# Patient Record
Sex: Male | Born: 1975 | Race: White | Hispanic: No | Marital: Single | Smoking: Former smoker
Health system: Southern US, Community
[De-identification: ages and names within clinical notes are randomized; demographics above are authoritative.]

## PROBLEM LIST (undated history)

## (undated) DIAGNOSIS — F410 Panic disorder [episodic paroxysmal anxiety] without agoraphobia: Secondary | ICD-10-CM

## (undated) DIAGNOSIS — K219 Gastro-esophageal reflux disease without esophagitis: Secondary | ICD-10-CM

## (undated) DIAGNOSIS — F1910 Other psychoactive substance abuse, uncomplicated: Secondary | ICD-10-CM

## (undated) DIAGNOSIS — J45909 Unspecified asthma, uncomplicated: Secondary | ICD-10-CM

## (undated) DIAGNOSIS — F419 Anxiety disorder, unspecified: Secondary | ICD-10-CM

## (undated) DIAGNOSIS — T7840XA Allergy, unspecified, initial encounter: Secondary | ICD-10-CM

## (undated) DIAGNOSIS — F1010 Alcohol abuse, uncomplicated: Secondary | ICD-10-CM

## (undated) DIAGNOSIS — I10 Essential (primary) hypertension: Secondary | ICD-10-CM

## (undated) HISTORY — PX: OTHER SURGICAL HISTORY: SHX169

## (undated) HISTORY — DX: Anxiety disorder, unspecified: F41.9

## (undated) HISTORY — DX: Allergy, unspecified, initial encounter: T78.40XA

## (undated) HISTORY — DX: Panic disorder (episodic paroxysmal anxiety): F41.0

## (undated) HISTORY — DX: Alcohol abuse, uncomplicated: F10.10

## (undated) HISTORY — DX: Unspecified asthma, uncomplicated: J45.909

## (undated) HISTORY — DX: Gastro-esophageal reflux disease without esophagitis: K21.9

## (undated) HISTORY — PX: TYMPANOSTOMY TUBE PLACEMENT: SHX32

## (undated) HISTORY — DX: Other psychoactive substance abuse, uncomplicated: F19.10

---

## 1998-01-30 ENCOUNTER — Encounter: Admission: RE | Admit: 1998-01-30 | Discharge: 1998-01-30 | Payer: Self-pay | Admitting: *Deleted

## 2001-04-28 ENCOUNTER — Emergency Department (HOSPITAL_COMMUNITY): Admission: EM | Admit: 2001-04-28 | Discharge: 2001-04-28 | Payer: Self-pay | Admitting: *Deleted

## 2001-04-28 ENCOUNTER — Encounter: Payer: Self-pay | Admitting: *Deleted

## 2007-09-07 ENCOUNTER — Encounter: Admission: RE | Admit: 2007-09-07 | Discharge: 2007-09-07 | Payer: Self-pay | Admitting: Gastroenterology

## 2015-10-13 ENCOUNTER — Ambulatory Visit: Payer: Self-pay | Admitting: Internal Medicine

## 2015-11-25 ENCOUNTER — Ambulatory Visit (INDEPENDENT_AMBULATORY_CARE_PROVIDER_SITE_OTHER): Payer: BLUE CROSS/BLUE SHIELD | Admitting: Family Medicine

## 2015-11-25 ENCOUNTER — Encounter: Payer: Self-pay | Admitting: Family Medicine

## 2015-11-25 VITALS — BP 122/72 | HR 64 | Wt 199.8 lb

## 2015-11-25 DIAGNOSIS — K219 Gastro-esophageal reflux disease without esophagitis: Secondary | ICD-10-CM

## 2015-11-25 DIAGNOSIS — F410 Panic disorder [episodic paroxysmal anxiety] without agoraphobia: Secondary | ICD-10-CM | POA: Diagnosis not present

## 2015-11-25 DIAGNOSIS — F1029 Alcohol dependence with unspecified alcohol-induced disorder: Secondary | ICD-10-CM

## 2015-11-25 LAB — CBC WITH DIFFERENTIAL/PLATELET
Basophils Absolute: 52 cells/uL (ref 0–200)
Basophils Relative: 1 %
Eosinophils Absolute: 208 cells/uL (ref 15–500)
Eosinophils Relative: 4 %
HCT: 47.9 % (ref 38.5–50.0)
Hemoglobin: 16.3 g/dL (ref 13.2–17.1)
Lymphocytes Relative: 34 %
Lymphs Abs: 1768 cells/uL (ref 850–3900)
MCH: 29.9 pg (ref 27.0–33.0)
MCHC: 34 g/dL (ref 32.0–36.0)
MCV: 87.9 fL (ref 80.0–100.0)
MPV: 11.9 fL (ref 7.5–12.5)
Monocytes Absolute: 364 cells/uL (ref 200–950)
Monocytes Relative: 7 %
Neutro Abs: 2808 cells/uL (ref 1500–7800)
Neutrophils Relative %: 54 %
Platelets: 137 10*3/uL — ABNORMAL LOW (ref 140–400)
RBC: 5.45 MIL/uL (ref 4.20–5.80)
RDW: 13.4 % (ref 11.0–15.0)
WBC: 5.2 10*3/uL (ref 4.0–10.5)

## 2015-11-25 NOTE — Patient Instructions (Signed)
-[facility] [phone] [facility] [phone] [facility] [phone]  Gastroesophageal Reflux Disease, Adult Normally, food travels down the esophagus and stays in the stomach to be digested. However, when a person has gastroesophageal reflux disease (GERD), food and stomach acid move back up into the esophagus. When this happens, the esophagus becomes sore and inflamed. Over time, GERD can create small holes (ulcers) in the lining of the esophagus.  CAUSES This condition is caused by a problem with the muscle between the esophagus and the stomach (lower esophageal sphincter, or LES). Normally, the LES muscle closes after food passes through the esophagus to the stomach. When the LES is weakened or abnormal, it does not close properly, and that allows food and stomach acid to go back up into the esophagus. The LES can be weakened by certain dietary substances, medicines, and medical conditions, including:  Tobacco use.  Pregnancy.  Having a hiatal hernia.  Heavy alcohol use.  Certain foods and beverages, such as coffee, chocolate, onions, and peppermint. RISK FACTORS This condition is more likely to develop in:  People who have an increased body weight.  People who have connective tissue disorders.  People who use NSAID medicines. SYMPTOMS Symptoms of this condition include:  Heartburn.  Difficult or painful swallowing.  The feeling of having a lump in the throat.  Abitter taste in the mouth.  Bad breath.  Having a large amount of saliva.  Having an upset or bloated stomach.  Belching.  Chest pain.  Shortness of breath or wheezing.  Ongoing (chronic) cough or a night-time cough.  Wearing away of tooth enamel.  Weight loss. Different conditions can cause chest pain. Make sure to see your health care provider if you experience chest pain. DIAGNOSIS Your health care provider will take a medical history and perform a physical exam. To  determine if you have mild or severe GERD, your health care provider may also monitor how you respond to treatment. You may also have other tests, including:  An endoscopy toexamine your stomach and esophagus with a small camera.  A test thatmeasures the acidity level in your esophagus.  A test thatmeasures how much pressure is on your esophagus.  A barium swallow or modified barium swallow to show the shape, size, and functioning of your esophagus. TREATMENT The goal of treatment is to help relieve your symptoms and to prevent complications. Treatment for this condition may vary depending on how severe your symptoms are. Your health care provider may recommend:  Changes to your diet.  Medicine.  Surgery. HOME CARE INSTRUCTIONS Diet  Follow a diet as recommended by your health care provider. This may involve avoiding foods and drinks such as:  Coffee and tea (with or without caffeine).  Drinks that containalcohol.  Energy drinks and sports drinks.  Carbonated drinks or sodas.  Chocolate and cocoa.  Peppermint and mint flavorings.  Garlic and onions.  Horseradish.  Spicy and acidic foods, including peppers, chili powder, curry powder, vinegar, hot sauces, and barbecue sauce.  Citrus fruit juices and citrus fruits, such as oranges, lemons, and limes.  Tomato-based foods, such as red sauce, chili, salsa, and pizza with red sauce.  Fried and fatty foods, such as donuts, french fries, potato chips, and high-fat dressings.  High-fat meats, such as hot dogs and fatty cuts of red and white meats, such as rib eye steak, sausage, ham, and bacon.  High-fat dairy items, such as whole milk, butter, and cream cheese.  Eat small, frequent meals instead of  large meals.  Avoid drinking large amounts of liquid with your meals.  Avoid eating meals during the 2-3 hours before bedtime.  Avoid lying down right after you eat.  Do not exercise right after you eat. General  Instructions  Pay attention to any changes in your symptoms.  Take over-the-counter and prescription medicines only as told by your health care provider. Do not take aspirin, ibuprofen, or other NSAIDs unless your health care provider told you to do so.  Do not use any tobacco products, including cigarettes, chewing tobacco, and e-cigarettes. If you need help quitting, ask your health care provider.  Wear loose-fitting clothing. Do not wear anything tight around your waist that causes pressure on your abdomen.  Raise (elevate) the head of your bed 6 inches (15cm).  Try to reduce your stress, such as with yoga or meditation. If you need help reducing stress, ask your health care provider.  If you are overweight, reduce your weight to an amount that is healthy for you. Ask your health care provider for guidance about a safe weight loss goal.  Keep all follow-up visits as told by your health care provider. This is important. SEEK MEDICAL CARE IF:  You have new symptoms.  You have unexplained weight loss.  You have difficulty swallowing, or it hurts to swallow.  You have wheezing or a persistent cough.  Your symptoms do not improve with treatment.  You have a hoarse voice. SEEK IMMEDIATE MEDICAL CARE IF:  You have pain in your arms, neck, jaw, teeth, or back.  You feel sweaty, dizzy, or light-headed.  You have chest pain or shortness of breath.  You vomit and your vomit looks like blood or coffee grounds.  You faint.  Your stool is bloody or black.  You cannot swallow, drink, or eat.   This information is not intended to replace advice given to you by your health care provider. Make sure you discuss any questions you have with your health care provider.   Document Released: 03/03/2005 Document Revised: 02/12/2015 Document Reviewed: 09/18/2014 Elsevier Interactive Patient Education [employer]2016 Elsevier Inc.

## 2015-11-25 NOTE — Progress Notes (Signed)
Subjective:    Patient ID: Juan Hodge, male    DOB: [birth year], 40 y.o.   MRN: [id number]  HPI Chief Complaint  Patient presents with  . new pt    panic attack- GI issues. alcohol use   He is new to the practice and here to establish care. No previous PCP. [place]ived in [place] years ago. States he had blood work then, nothing since.  States he suffers from generalized anxiety, panic attacks, alcoholism, and GI issues. Stopped marijuana about 4-5 years ago. Stopped using cocaine in his 5120s. States he has abused benzos in past. Has addictive personality.  Sees a psychologist, Vito Bergeramala Duncan on [address] for about a year and likes her. He states he wants to stop drinking. Has been taking nexium for past 2 weeks without relief of reflux. States he has tried multiple PPIs in past. Ongoing history of reflux, belching, and occasional vomiting. Denies hematemesis.   PMH: questionable history of high blood pressure and cholesterol.   Daily alcohol use since his 7020s. Started out with binge drinking.  States he has been struggling with hangovers more lately and panic attacks more often . States he has multiple anxiety attacks daily. Has panic attacks about 3-4 per month, lasts 10 minutes to 1 hour. Exercise brings on panic. Heartburn also causes a great deal of anxiety. States he aborts his panic attacks by going to his mothers and she can talk him down. He has also taken his mother's Klonopin in the past with relief.  Drinks half to whole bottle of wine now. He is dependent for sleep. States he has been this way for several years.   Tried to go 2-3 days without drinking and he could not sleep. Denies drinking during the day. States he works during the day. Lately he has been having a difficult time at work due to panic and anxiety.   He is anxious that his mom is going out of town for a couple of weeks since he depends on her to help him with panic attacks.   States he went to [facility] a few  years ago and had a barium swallow.  Spent 2 years in prison 2006-2008. Developed stomach issues then, he was belching and vomiting quite often then.   Does not smoke.   He went to inpatient rehab at age 40 for etoh, acid, marijuana. In prison he worked a 12 step program.   Review of Systems Pertinent positives and negatives in the history of present illness.     Objective:   Physical Exam  Constitutional: He is oriented to person, place, and time. He appears well-developed and well-nourished. No distress.  Eyes: Conjunctivae and EOM are normal. Pupils are equal, round, and reactive to light. No scleral icterus.  Neck: Normal range of motion. Neck supple. No thyromegaly present.  Cardiovascular: Normal rate, regular rhythm and normal heart sounds.  Exam reveals no gallop and no friction rub.   No murmur heard. Pulmonary/Chest: Effort normal and breath sounds normal.  Abdominal: Soft. Bowel sounds are normal. He exhibits no distension. There is no hepatosplenomegaly. There is no tenderness. There is no rebound and no guarding.  Lymphadenopathy:    He has no cervical adenopathy.  Neurological: He is alert and oriented to person, place, and time. He has normal reflexes. He displays no tremor. Coordination normal.  No asterixis  Skin: Skin is warm and dry.  Psychiatric: He has a normal mood and affect. His behavior is normal.  Judgment and thought content normal.   BP 122/72 mmHg  Pulse 64  Wt 199 lb 12.8 oz (90.629 kg)     Assessment & Plan:  Gastroesophageal reflux disease, esophagitis presence not specified - Plan: CBC with Differential/Platelet, Comprehensive metabolic panel  Alcohol dependence with unspecified alcohol-induced disorder (HCC) - Plan: CBC with Differential/Platelet, Comprehensive metabolic panel  Panic disorder - Plan: CBC with Differential/Platelet, Comprehensive metabolic panel  Discussed patient with Dr. Susann Givens and agree that what is best for this patient is  for him to contact [facility] for alcohol detox and addiction issues. It would not be appropriate or safe for me to prescribe medication for anxiety or panic. He is not suicidal and I do not feel that he is in any harm.  Recommend that if his panic worsens or he has thoughts of SI or HI that he call 911 or go immediately to  [facility].   Discussed that I will treat his GERD and he also has an appointment with Dr. Leone Payor in July which he made prior to today's visit. Recommend strongly that he keep this appointment. Dexilant sample given with instructions. He will switch and take 2 Nexium tabs 30 minutes prior to breakfast when the sample is out. Discussed that cutting back on alcohol will help reflux and also discussed GERD management.  Follow up in 1 month for GERD or sooner if needed.  Will call with lab results.  Spent a minimum of 45 minutes face to face with patient and at least 50% was in counseling and coordination of care.

## 2015-11-26 LAB — COMPREHENSIVE METABOLIC PANEL
ALT: 30 U/L (ref 9–46)
AST: 20 U/L (ref 10–40)
Albumin: 4.6 g/dL (ref 3.6–5.1)
Alkaline Phosphatase: 46 U/L (ref 40–115)
BUN: 13 mg/dL (ref 7–25)
CO2: 22 mmol/L (ref 20–31)
Calcium: 9.4 mg/dL (ref 8.6–10.3)
Chloride: 104 mmol/L (ref 98–110)
Creat: 0.86 mg/dL (ref 0.60–1.35)
Glucose, Bld: 86 mg/dL (ref 65–99)
Potassium: 4.2 mmol/L (ref 3.5–5.3)
Sodium: 138 mmol/L (ref 135–146)
Total Bilirubin: 0.8 mg/dL (ref 0.2–1.2)
Total Protein: 7.3 g/dL (ref 6.1–8.1)

## 2015-12-18 ENCOUNTER — Encounter: Payer: Self-pay | Admitting: Family Medicine

## 2015-12-18 ENCOUNTER — Ambulatory Visit (INDEPENDENT_AMBULATORY_CARE_PROVIDER_SITE_OTHER): Payer: BLUE CROSS/BLUE SHIELD | Admitting: Family Medicine

## 2015-12-18 VITALS — BP 124/84 | HR 72 | Wt 199.6 lb

## 2015-12-18 DIAGNOSIS — K219 Gastro-esophageal reflux disease without esophagitis: Secondary | ICD-10-CM | POA: Diagnosis not present

## 2015-12-18 DIAGNOSIS — F419 Anxiety disorder, unspecified: Secondary | ICD-10-CM | POA: Insufficient documentation

## 2015-12-18 DIAGNOSIS — F410 Panic disorder [episodic paroxysmal anxiety] without agoraphobia: Secondary | ICD-10-CM

## 2015-12-18 DIAGNOSIS — F1010 Alcohol abuse, uncomplicated: Secondary | ICD-10-CM | POA: Insufficient documentation

## 2015-12-18 NOTE — Progress Notes (Signed)
   Subjective:    Patient ID: Juan Hodge, male    DOB: 01-Aug-1975, 40 y.o.   MRN: [id number]  HPI Chief Complaint  Patient presents with  . follow-up    follow-up on GERD- med not helping   He is here for follow up on GERD. He tried Dexilant and has been taking Nexium 2 tabs in the morning for several weeks without any relief of symptoms. States he has been doing appropriate GERD management that we discussed her last visit and this has not helped. Has appointment with GI next week.   States he occasionally wakes up with his heart "racing" for about a year and this has gotten some better. Has been meditating more and using sleep apps. Has not occurred lately. He is still drinking alcohol every evening. Has tried to cut back to 1/2 bottle of wine nightly  Reports history of Panic attacks mainly in the morning in the morning but not as often. He has been seen a psychologist and states he would like to be referred to a psychiatrist as well. States he is interested in possibly taking medication for his symptoms. He does admit however that he has an addictive personality and in the past has abused alcohol and other substances. Also states he has abused anxiety medications in the past. Denies SI/HI. Denies fever, chills, headache, dizziness, chest pain, DOE, cough, nausea, vomiting, diarrhea.   Review of Systems Pertinent positives and negatives in the history of present illness.     Objective:   Physical Exam BP 124/84 mmHg  Pulse 72  Wt 199 lb 9.6 oz (90.538 kg)  Alert and oriented and in no acute distress. Pharyngeal area is normal.  Cardiac exam shows a regular sinus rhythm without murmurs or gallops. Extremities without edema.      Assessment & Plan:  Gastroesophageal reflux disease, esophagitis presence not specified  Alcohol abuse, daily use - Plan: Ambulatory referral to Psychiatry  Anxiety disorder, unspecified anxiety disorder type - Plan: Ambulatory referral to  Psychiatry  Panic attacks - Plan: Ambulatory referral to Psychiatry  Plan to have him see GI next week and appreciate feedback regarding his symptoms. Continue GERD management as discussed and avoid offending foods. He may continue on Nexium until seeing GI.  He continues to drink alcohol every evening. Is not interested in detox at this time.  Referral made to psychiatry for anxiety and panic. I do not feel that he is harmful to himself or others. Follow up in 1 month.

## 2015-12-23 ENCOUNTER — Encounter: Payer: Self-pay | Admitting: Internal Medicine

## 2015-12-23 ENCOUNTER — Ambulatory Visit (INDEPENDENT_AMBULATORY_CARE_PROVIDER_SITE_OTHER): Payer: BLUE CROSS/BLUE SHIELD | Admitting: Internal Medicine

## 2015-12-23 VITALS — BP 130/90 | HR 78 | Ht 70.0 in | Wt 199.0 lb

## 2015-12-23 DIAGNOSIS — F1010 Alcohol abuse, uncomplicated: Secondary | ICD-10-CM | POA: Diagnosis not present

## 2015-12-23 DIAGNOSIS — F410 Panic disorder [episodic paroxysmal anxiety] without agoraphobia: Secondary | ICD-10-CM

## 2015-12-23 DIAGNOSIS — K219 Gastro-esophageal reflux disease without esophagitis: Secondary | ICD-10-CM

## 2015-12-23 MED ORDER — SUCRALFATE 1 G PO TABS: 1.0000 g | ORAL_TABLET | Freq: Three times a day (TID) | ORAL | Status: DC

## 2015-12-23 NOTE — Progress Notes (Signed)
Referred by Hetty BlendVickie Henson, NP  Subjective:    Patient ID: Juan FeltJeffrey M Phillis, male    DOB: June 26, 1975, [age] y.o.   MRN: [id number]  CC: epigastric pain and vomiting  HPI  40 y.o. Male with a pmh of anxiety, panic attacks, GERD, and alcohol abuse, presenting to the office for epigastric pain for the past 10 years, described as burning. Associates belching, regurgitation, bloating with the pain. Worsened when lying flat and when eating spicy or acidic foods. States he has tried multiple PPIs with no relief in symptoms. Has diarrhea  3 days out of a week,  ranging from loose to watery stools. No weight loss, dysphasia, or hematemesis, melena, or hematochezia. Drinks 1/2 a bottle of wine every night, but states he is cutting back. States he awakes early with increased heart rate, clammy hands, and worry that he is having a panic attack, resolves with meditation techniques he learned with psychologist. Denies fevers, chills, early satiety, nausea, and constipation.   Allergies  Allergen Reactions  . Erythromycin Other (See Comments)    Tears stomach up   Outpatient Prescriptions Prior to Visit  Medication Sig Dispense Refill  . Esomeprazole Magnesium (NEXIUM PO) Take 2 capsules by mouth daily.      No facility-administered medications prior to visit.   Past Medical History  Diagnosis Date  . Substance abuse   . Alcohol abuse, daily use   . Anxiety   . Panic attacks   . GERD (gastroesophageal reflux disease)   . Asthma    Past Surgical History  Procedure Laterality Date  . Tubes in ears      as a child   Social History   Social History  . Marital Status: Single    Spouse Name: N/A  . Number of Children: 0  . Years of Education: N/A   Occupational History  . sales    Social History Main Topics  . Smoking status: Never Smoker   . Smokeless tobacco: Never Used  . Alcohol Use: 0.0 oz/week    0 Standard drinks or equivalent per week     Comment: 2-4 glasses of wine. (1/2-1 bottle  a night)  . Drug Use: No     Comment: former user of marijuana and cocaine  . Sexual Activity: Not Asked   Other Topics Concern  . None   Social History Narrative   Family History  Problem Relation Age of Onset  . Heart disease Father       Review of Systems See HPI, all other systems are negative    Objective:   Physical Exam  @BP  130/90 mmHg  Pulse 78  Ht 5\' 10"  (1.778 m)  Wt 199 lb (90.266 kg)  BMI 28.55 kg/m2@  General:  Anxious appearing, well-developed, well-nourished, but no acute distress Eyes:  anicteric. ENT:   Mouth and posterior pharynx free of lesions.  Neck:   supple w/o thyromegaly or mass.  Lungs: Clear to auscultation bilaterally. Heart:  S1S2, no rubs, murmurs, gallops. Abdomen:  soft, non-tender, no hepatosplenomegaly, hernia, or mass and BS+.  Lymph:  no cervical or supraclavicular adenopathy. Extremities:   no edema, cyanosis or clubbing Skin   no rash. Neuro:  A&O x 3.  Psych:  appropriate mood and  Affect.   Data Reviewed: Labs from 11/25/15 Barium [facility]GI from 09/07/07      Assessment & Plan:   Encounter Diagnoses  Name Primary?  . Gastroesophageal reflux disease, esophagitis presence not specified Yes  . Panic attacks   .  Alcohol abuse    Symptoms appear consistent with acid reflux and panic attacks, mostly panic. Given his history of PPI failing we will prescribe carafate to help with epigastric pain and reflux. Encouraged him to continue meditation techniques to help with panic attacks. PCP is referring him to see a psychiatrist to help with his anxiety. Medication appears indicated.    Patient is aware of alcohol abuse and is actively trying to cut back on alcohol consumption.  Debbra Riding PA-S  I have seen the patient with Mr. Harlon Flor and he has served as a Neurosurgeon.  ZO:XWRUEA Suezanne Jacquet, NP

## 2015-12-23 NOTE — Patient Instructions (Addendum)
We have sent the following medications to your pharmacy for you to pick up at your convenience: Carafate  Follow up with Dr Leone PayorGessner as needed.  Follow reflux diet. See below.  Make sure to continue trying to reduce your alcohol consumption.  Make sure to follow with psychiatry.  If you are age 40 or older, your body mass index should be between 23-30. Your Body mass index is 28.55 kg/(m^2). If this is out of the aforementioned range listed, please consider follow up with your Primary Care Provider.  If you are age 40 or younger, your body mass index should be between 19-25. Your Body mass index is 28.55 kg/(m^2). If this is out of the aformentioned range listed, please consider follow up with your Primary Care Provider.    Thank you for choosing [facility]  Dr Stan Headarl Gessner  ________________________________________________________________ Food Choices for Gastroesophageal Reflux Disease, Adult When you have gastroesophageal reflux disease (GERD), the foods you eat and your eating habits are very important. Choosing the right foods can help ease the discomfort of GERD. WHAT GENERAL GUIDELINES DO I NEED TO FOLLOW?  Choose fruits, vegetables, whole grains, low-fat dairy products, and low-fat meat, fish, and poultry.  Limit fats such as oils, salad dressings, butter, nuts, and avocado.  Keep a food diary to identify foods that cause symptoms.  Avoid foods that cause reflux. These may be different for different people.  Eat frequent small meals instead of three large meals each day.  Eat your meals slowly, in a relaxed setting.  Limit fried foods.  Cook foods using methods other than frying.  Avoid drinking alcohol.  Avoid drinking large amounts of liquids with your meals.  Avoid bending over or lying down until 2-3 hours after eating. WHAT FOODS ARE NOT RECOMMENDED? The following are some foods and drinks that may worsen your symptoms: Vegetables Tomatoes. Tomato juice.  Tomato and spaghetti sauce. Chili peppers. Onion and garlic. Horseradish. Fruits Oranges, grapefruit, and lemon (fruit and juice). Meats High-fat meats, fish, and poultry. This includes hot dogs, ribs, ham, sausage, salami, and bacon. Dairy Whole milk and chocolate milk. Sour cream. Cream. Butter. Ice cream. Cream cheese.  Beverages Coffee and tea, with or without caffeine. Carbonated beverages or energy drinks. Condiments Hot sauce. Barbecue sauce.  Sweets/Desserts Chocolate and cocoa. Donuts. Peppermint and spearmint. Fats and Oils High-fat foods, including [place] fries and potato chips. Other Vinegar. Strong spices, such as black pepper, white pepper, red pepper, cayenne, curry powder, cloves, ginger, and chili powder. The items listed above may not be a complete list of foods and beverages to avoid. Contact your dietitian for more information.   This information is not intended to replace advice given to you by your health care provider. Make sure you discuss any questions you have with your health care provider.   Document Released: 05/24/2005 Document Revised: 06/14/2014 Document Reviewed: 03/28/2013 Elsevier Interactive Patient Education [employer]2016 Elsevier Inc.  I appreciate the opportunity to care for you. Stan Headarl Gessner, MD, [facility]

## 2015-12-24 ENCOUNTER — Encounter: Payer: Self-pay | Admitting: Internal Medicine

## 2016-03-10 ENCOUNTER — Ambulatory Visit: Payer: BLUE CROSS/BLUE SHIELD | Admitting: Family Medicine

## 2016-03-12 ENCOUNTER — Encounter: Payer: Self-pay | Admitting: Family Medicine

## 2016-03-23 ENCOUNTER — Encounter: Payer: Self-pay | Admitting: Family Medicine

## 2016-03-23 ENCOUNTER — Ambulatory Visit (INDEPENDENT_AMBULATORY_CARE_PROVIDER_SITE_OTHER): Payer: BLUE CROSS/BLUE SHIELD | Admitting: Family Medicine

## 2016-03-23 VITALS — BP 140/82 | HR 91 | Wt 197.6 lb

## 2016-03-23 DIAGNOSIS — F1311 Sedative, hypnotic or anxiolytic abuse, in remission: Secondary | ICD-10-CM | POA: Diagnosis not present

## 2016-03-23 DIAGNOSIS — F410 Panic disorder [episodic paroxysmal anxiety] without agoraphobia: Secondary | ICD-10-CM

## 2016-03-23 DIAGNOSIS — F411 Generalized anxiety disorder: Secondary | ICD-10-CM | POA: Diagnosis not present

## 2016-03-23 DIAGNOSIS — F1010 Alcohol abuse, uncomplicated: Secondary | ICD-10-CM

## 2016-03-23 NOTE — Progress Notes (Signed)
   Subjective:    Patient ID: Juan Hodge, male    DOB: 1975/09/19, 40 y.o.   MRN: [id number]  HPI Chief Complaint  Patient presents with  . aniexty    aniexty   He is a 40 year old male with a history of anxiety, panic attacks, alcohol abuse, GERD and benzodiazapine abuse who is here with complaints of anxiety. He is seeing a psychologist Vito Bergeramala Duncan in [place] for this long time issue and states she diagnosed him with generalized anxiety.  States he saw a psychiatrist Jaquita RectorKaren Davis and was prescribed Buspar. States he did not like how it made him feel. States he had headaches on the medication and it did not help his anxiety. Took the medication for a month and a half and states he gave it ample time to work.  States he is no longer seeing his psychiatrist because he was late to an appointment and they would not see him. States he does not want to see her again, states they did not connect.  He is requesting that I speak with his psychologist regarding his diagnosis and possible treatment in the future.  States panic attacks are happening less often. States he is having issues sleeping.  States he is drinking less alcohol. Has been not drinking on some occasions.   He is in a new relationship, with an ex girlfriend and states this is going well.  Denies SI or HI.  States he feels physically well.  Denies fever, chills, headache, chest pain, abdominal pain, GI or GU symptoms.    Past Medical History:  Diagnosis Date  . Alcohol abuse, daily use   . Anxiety   . Asthma   . GERD (gastroesophageal reflux disease)   . Panic attacks   . Substance abuse    GAD 7 : Generalized Anxiety Score 03/23/2016 11/25/2015  Nervous, Anxious, on Edge 3 3  Control/stop worrying 0 3  Worry too much - different things 3 3  Trouble relaxing 3 3  Restless 0 0  Easily annoyed or irritable 3 3  Afraid - awful might happen 3 1  Total GAD 7 Score 15 16  Anxiety Difficulty Somewhat difficult Not difficult  at all     Review of Systems Pertinent positives and negatives in the history of present illness.     Objective:   Physical Exam BP 140/82   Pulse 91   Wt 197 lb 9.6 oz (89.6 kg)   BMI 28.35 kg/m   Alert and oriented and in no acute distress. Not otherwise examined.      Assessment & Plan:  Generalized anxiety disorder  Panic attacks  Benzodiazepine abuse in remission  Alcohol abuse, daily use  Plan to have him sign a written consent that allows his psychologist and I to discuss his case. He actually reports some improvement in panic attacks and anxiety but would like to try a different medication for this. He has admitted to abusing anti-anxiety medications at a previous visit. Will request from patient to also speak to the psychiatrist he was seeing.  Discussed patient with Dr. Susann GivensLalonde and we are in agreement that nothing has changed regarding the fact that I am not interested in prescribing psychotropic medications for him and that he will need to find a new psychiatrist to help him with this. Encouraged him to continue cutting back on alcohol use.  Follow up after I hear from his psychologist.

## 2016-03-31 ENCOUNTER — Telehealth: Payer: Self-pay | Admitting: Family Medicine

## 2016-03-31 NOTE — Telephone Encounter (Signed)
His therapist Juan Hodge contacted me at the patient's request. She is concerned about his GERD symptoms and was not aware that patient had been evaluated by GI in the past few weeks. She would also like for me to consider prescribing anti-anxiety medication for him. Discussed that he has a history of benzodiazapine abuse and drinks alcohol daily. Plan to obtain release to discuss patient with his psychiatrist, Juan Hodge, who he does not want to see anymore. He was taking Buspar and did not like this medication. Plan to refer him to another psychiatrist and his therapist agrees with this plan.

## 2016-05-05 ENCOUNTER — Telehealth: Payer: Self-pay

## 2016-05-05 NOTE — Telephone Encounter (Signed)
Records from Deatra RobinsonKaren Jones, NP placed in your folder for review. Trixie Rude/RLB

## 2017-01-17 ENCOUNTER — Ambulatory Visit (INDEPENDENT_AMBULATORY_CARE_PROVIDER_SITE_OTHER): Payer: Self-pay | Admitting: Family Medicine

## 2017-01-17 ENCOUNTER — Encounter: Payer: Self-pay | Admitting: Family Medicine

## 2017-01-17 VITALS — BP 120/90 | HR 86 | Temp 98.2°F | Wt 209.8 lb

## 2017-01-17 DIAGNOSIS — L02415 Cutaneous abscess of right lower limb: Secondary | ICD-10-CM

## 2017-01-17 DIAGNOSIS — L0293 Carbuncle, unspecified: Secondary | ICD-10-CM

## 2017-01-17 LAB — COMPREHENSIVE METABOLIC PANEL
ALT: 33 U/L (ref 9–46)
AST: 23 U/L (ref 10–40)
Albumin: 4.3 g/dL (ref 3.6–5.1)
Alkaline Phosphatase: 56 U/L (ref 40–115)
BUN: 12 mg/dL (ref 7–25)
CO2: 24 mmol/L (ref 20–32)
Calcium: 9.9 mg/dL (ref 8.6–10.3)
Chloride: 103 mmol/L (ref 98–110)
Creat: 0.98 mg/dL (ref 0.60–1.35)
Glucose, Bld: 83 mg/dL (ref 65–99)
Potassium: 4.2 mmol/L (ref 3.5–5.3)
Sodium: 139 mmol/L (ref 135–146)
Total Bilirubin: 0.9 mg/dL (ref 0.2–1.2)
Total Protein: 7.2 g/dL (ref 6.1–8.1)

## 2017-01-17 LAB — CBC WITH DIFFERENTIAL/PLATELET
Basophils Absolute: 0 cells/uL (ref 0–200)
Basophils Relative: 0 %
Eosinophils Absolute: 255 cells/uL (ref 15–500)
Eosinophils Relative: 5 %
HCT: 46.7 % (ref 38.5–50.0)
Hemoglobin: 16.1 g/dL (ref 13.2–17.1)
Lymphocytes Relative: 37 %
Lymphs Abs: 1887 cells/uL (ref 850–3900)
MCH: 30.1 pg (ref 27.0–33.0)
MCHC: 34.5 g/dL (ref 32.0–36.0)
MCV: 87.5 fL (ref 80.0–100.0)
MPV: 11.7 fL (ref 7.5–12.5)
Monocytes Absolute: 561 cells/uL (ref 200–950)
Monocytes Relative: 11 %
Neutro Abs: 2397 cells/uL (ref 1500–7800)
Neutrophils Relative %: 47 %
Platelets: 147 10*3/uL (ref 140–400)
RBC: 5.34 MIL/uL (ref 4.20–5.80)
RDW: 13.2 % (ref 11.0–15.0)
WBC: 5.1 10*3/uL (ref 4.0–10.5)

## 2017-01-17 MED ORDER — MUPIROCIN 2 % EX OINT: 1.0000 "application " | TOPICAL_OINTMENT | Freq: Two times a day (BID) | CUTANEOUS | 0 refills | Status: DC

## 2017-01-17 MED ORDER — SULFAMETHOXAZOLE-TRIMETHOPRIM 800-160 MG PO TABS: 1.0000 | ORAL_TABLET | Freq: Two times a day (BID) | ORAL | 0 refills | Status: DC

## 2017-01-17 NOTE — Patient Instructions (Signed)
Take the antibiotic by mouth and use the topical antibiotic.  Use warm compresses.   Return if you notice any new or worsening symptoms.   We will call you with lab results.

## 2017-01-17 NOTE — Progress Notes (Signed)
   Subjective:    Patient ID: Juan Hodge, male    DOB: September 04, 1975, 41 y.o.   MRN: [id number]  HPI Chief Complaint  Patient presents with  . boil    boil on right leg, 3 days ago. popped it himself 2 days ago.    He is here due to a 3 day history of a sore to his right lower leg. States it started as a pimple and he popped it 2 days ago and yellowish pus was expelled. States he has had 4 boils in different areas over the past 2 months.  Denies history of MRSA, MRSA or any autoimmune issues.   He is engaged now. Doing well emotionally.  States he has been eating too many carbohydrates and sugar.   Denies fever, chills, dizziness, chest pain, shortness of breath, abdominal pain, N/V/D.   Reviewed allergies, medications, past medical, surgical, and social history.    Review of Systems Pertinent positives and negatives in the history of present illness.     Objective:   Physical Exam BP 120/90   Pulse 86   Temp 98.2 F (36.8 C) (Oral)   Wt 209 lb 12.8 oz (95.2 kg)   BMI 30.10 kg/m   Right lateral lower extremity with a 0.5 cm open area that is draining clear fluid.  Mild erythema to immediate outer edge. No induration or fluctuance. RLE is neurovascularly intact.       Assessment & Plan:  Abscess of right leg - Plan: sulfamethoxazole-trimethoprim (BACTRIM DS,SEPTRA DS) 800-160 MG tablet, CBC with Differential/Platelet, Comprehensive metabolic panel, HIV antibody, mupirocin ointment (BACTROBAN) 2 %  Recurrent boils - Plan: CBC with Differential/Platelet, Comprehensive metabolic panel, HIV antibody  Will cover him with Bactrim and topical mupirocin and check labs to look for underlying etiology for recurrent boils.  He is worried about finances and no longer has insurance. He will follow up if symptoms worsen or are not improving

## 2017-01-18 LAB — HIV ANTIBODY (ROUTINE TESTING W REFLEX): HIV 1&2 Ab, 4th Generation: NONREACTIVE

## 2017-07-27 ENCOUNTER — Ambulatory Visit (INDEPENDENT_AMBULATORY_CARE_PROVIDER_SITE_OTHER): Payer: Self-pay | Admitting: Family Medicine

## 2017-07-27 ENCOUNTER — Encounter: Payer: Self-pay | Admitting: Family Medicine

## 2017-07-27 VITALS — BP 130/90 | HR 70 | Temp 97.9°F | Resp 16 | Wt 214.6 lb

## 2017-07-27 DIAGNOSIS — G44209 Tension-type headache, unspecified, not intractable: Secondary | ICD-10-CM

## 2017-07-27 DIAGNOSIS — M542 Cervicalgia: Secondary | ICD-10-CM

## 2017-07-27 MED ORDER — DICLOFENAC SODIUM 75 MG PO TBEC: 75.0000 mg | DELAYED_RELEASE_TABLET | Freq: Two times a day (BID) | ORAL | 0 refills | Status: DC

## 2017-07-27 NOTE — Progress Notes (Signed)
   Subjective:    Patient ID: Juan Hodge, male    DOB: 1976-03-11, 42 y.o.   MRN: [id number]  HPI Chief Complaint  Patient presents with  . head and neck pain    head and neck pain. started back in first of febrauary when he had a sinus infection, when he coughed he hurt something pop in the neck. cough has gone away and sinus went away but has a headache due to the pain   He is here with complaints of posterior bilateral neck pain and intermittent headaches that started approximately 3 weeks ago while he had a URI with a severe cough. States URI symptoms have resolved.   States he went to urgent care for these symptoms and was prescribed pain medication and flexeril. States flexeril did help symptoms.   States he went to the chiropractor and got a massage and this did not help.  Reports the chiropractor took X rays and told him he had an abnormal curvature.   He has been taking Ibuprofen 800 mg twice daily and using heat and ice.   Denies fever, chills, vision changes, tinnitus, dizziness, fatigue, unexplained weight change, sore throat, cough, chest pain, palpitations, shortness of breath, abdominal pain, N/V/D, rash.   Reviewed allergies, medications, past medical, surgical, family, and social history.    Review of Systems Pertinent positives and negatives in the history of present illness.     Objective:   Physical Exam  Constitutional: He is oriented to person, place, and time. He appears well-developed and well-nourished. No distress.  HENT:  Right Ear: Tympanic membrane and ear canal normal.  Left Ear: Tympanic membrane and ear canal normal.  Mouth/Throat: Uvula is midline, oropharynx is clear and moist and mucous membranes are normal.  Eyes: Conjunctivae, EOM and lids are normal. Pupils are equal, round, and reactive to light.  Neck: Trachea normal and normal range of motion. Neck supple. No spinous process tenderness and no muscular tenderness present. No thyromegaly  present.  Cardiovascular: Normal rate, regular rhythm and normal heart sounds.  Pulmonary/Chest: Effort normal and breath sounds normal.  Musculoskeletal: Normal range of motion.  Lymphadenopathy:    He has no cervical adenopathy.  Neurological: He is alert and oriented to person, place, and time. He has normal strength. No cranial nerve deficit or sensory deficit. Coordination and gait normal.  Skin: Skin is warm and dry. No rash noted. No pallor.  Psychiatric: He has a normal mood and affect. His speech is normal and behavior is normal. Thought content normal.   BP 130/90   Pulse 70   Temp 97.9 F (36.6 C) (Oral)   Resp 16   Wt 214 lb 9.6 oz (97.3 kg)   SpO2 98%   BMI 30.79 kg/m       Assessment & Plan:  Cervicalgia - Plan: Ambulatory referral to Physical Therapy  Neck pain, bilateral - Plan: diclofenac (VOLTAREN) 75 MG EC tablet, Ambulatory referral to Physical Therapy  Tension-type headache, not intractable, unspecified chronicity pattern  Discussed that his exam is unremarkable and reassured him that this does not appear to be anything worrisome. He has apparently failed conservative therapy with treatment from urgent care and his chiropractor. Will have him try diclofenac and refer him to PT.

## 2017-07-27 NOTE — Patient Instructions (Addendum)
Continue using ice or heat, topical analgesic and add the diclofenac.   I think you could benefit from physical therapy. Let me know if this is an option.   Call or return in 2 weeks.

## 2017-12-02 ENCOUNTER — Ambulatory Visit (INDEPENDENT_AMBULATORY_CARE_PROVIDER_SITE_OTHER): Payer: Self-pay | Admitting: Medical

## 2017-12-02 ENCOUNTER — Encounter: Payer: Self-pay | Admitting: Medical

## 2017-12-02 VITALS — BP 140/100 | HR 84 | Temp 98.2°F | Resp 16 | Ht 71.0 in | Wt 211.4 lb

## 2017-12-02 DIAGNOSIS — F411 Generalized anxiety disorder: Secondary | ICD-10-CM

## 2017-12-02 DIAGNOSIS — Z8249 Family history of ischemic heart disease and other diseases of the circulatory system: Secondary | ICD-10-CM

## 2017-12-02 DIAGNOSIS — R079 Chest pain, unspecified: Secondary | ICD-10-CM

## 2017-12-02 MED ORDER — METOPROLOL TARTRATE 25 MG PO TABS
25.0000 mg | ORAL_TABLET | Freq: Two times a day (BID) | ORAL | 2 refills | Status: DC
Start: 2017-12-02 — End: 2018-05-08

## 2017-12-02 MED ORDER — ALPRAZOLAM 0.25 MG PO TABS: 0.2500 mg | ORAL_TABLET | Freq: Two times a day (BID) | ORAL | 0 refills | Status: DC | PRN

## 2017-12-02 NOTE — Progress Notes (Signed)
Subjective: Chief Complaint  Patient presents with  . chest pain    stressful issues, dad died last week of heart attack, lots of death recently  chest pain X 2 weeks   Here for several concerns.  Has concerns about chest pain and anxiety.  Recently has had a lot of big stressors in his life.  During the first week of June best friend died.  right after the funeral,his 239 month old went in for major surgery to remove tumor from her left lung. The surgery was a long 6 hours surgery.  She is doing ok currently.  His parents had come down to visit her in the hospital.   His dad has hx/o heart disease, and unexpectedly died of cardiac arrest the next day after daughter's surgery.    Since then he has been having some chest pains, palpitations that don't feel like GERD.   He says that his body type and prior habits are like fathers so he worries about heart disease.  BP has been up at times in the past.  He has had issues with anxiety in the past, had been seeing psychiatry a few years ago.  Was on xanax 1 tablet 1-2 times per week which really helped him remain calm.  After a while his anxiety resolved.   Didn't have a lot of issues until the recent stressors.  He is not using beer like he has in the past.  He knows that xanax can be habit forming, but he had found that taking 1-2 tablets per week seemed to help.   He is requesting this.   [place] also [place], but this is a planned move.  Seeing therapist  Since 2 years ago, sees 2 x / months, goes in first, then he and fiance have couples counseling together.     Had not done well on Buspar prior.   Objective: BP (!) 140/100   Pulse 84   Temp 98.2 F (36.8 C) (Oral)   Resp 16   Ht 5\' 11"  (1.803 m)   Wt 211 lb 6.4 oz (95.9 kg)   SpO2 97%   BMI 29.48 kg/m   BP Readings from Last 3 Encounters:  12/02/17 (!) 140/100  07/27/17 130/90  01/17/17 120/90   Wt Readings from Last 3 Encounters:   12/02/17 211 lb 6.4 oz (95.9 kg)  07/27/17 214 lb 9.6 oz (97.3 kg)  01/17/17 209 lb 12.8 oz (95.2 kg)   General appearance: alert, no distress, WD/WN,  Neck: supple, no lymphadenopathy, no thyromegaly, no masses Heart: RRR, normal S1, S2, no murmurs Lungs: CTA bilaterally, no wheezes, rhonchi, or rales Abdomen: +bs, soft, non tender, non distended, no masses, no hepatomegaly, no splenomegaly Pulses: 2+ symmetric, upper and lower extremities, normal cap refill Ext: no edema Psych: pleasant, answers questions appropriate   Adult ECG Report  Indication: chest pain  Rate: 73 bpm  Rhythm: normal sinus rhythm  QRS Axis: -18 degrees  PR Interval: 136ms  QRS Duration: 106ms  QTc: 412ms  Conduction Disturbances: none  Other Abnormalities: incomplete RBBB  Patient's cardiac risk factors are: obesity (BMI >= 30 kg/m2).  EKG comparison: none  Narrative Interpretation: incomplete RBBB     Assessment: Encounter Diagnoses  Name Primary?  . Generalized anxiety disorder Yes  . Chest pain, unspecified type   . Family history of heart disease     Plan: Discussed concerns.  Expressed sympathy for his recent losses.  Encouraged him to also appreciate the success of his daughter's surgery.  Begin trial of Metoprolol for BP and anxiety.    Can use Xanax sparingly.   C/t counseling.  Discussed prior issues with alcohol abuse.   The plan is not maintenance therapy with xanax but prn use for worse anxiety attacks.   May need to coordinate with therapist on progress.  Discussed risks of medications above.   F/u 2 wk.   Juan Hodge was seen today for chest pain.  Diagnoses and all orders for this visit:  Generalized anxiety disorder  Chest pain, unspecified type -     EKG 12-Lead  Family history of heart disease  Other orders -     metoprolol tartrate (LOPRESSOR) 25 MG tablet; Take 1 tablet (25 mg total) by mouth 2 (two) times daily. 1 tablet po BID for blood pressure -     ALPRAZolam  (XANAX) 0.25 MG tablet; Take 1 tablet (0.25 mg total) by mouth 2 (two) times daily as needed for anxiety.

## 2018-01-12 ENCOUNTER — Ambulatory Visit
Admission: RE | Admit: 2018-01-12 | Discharge: 2018-01-12 | Disposition: A | Discharge status: Home / Self Care | Payer: No Typology Code available for payment source | Source: Ambulatory Visit | Attending: Family Medicine | Admitting: Family Medicine

## 2018-01-12 ENCOUNTER — Ambulatory Visit (INDEPENDENT_AMBULATORY_CARE_PROVIDER_SITE_OTHER): Payer: Self-pay | Admitting: Family Medicine

## 2018-01-12 ENCOUNTER — Encounter: Payer: Self-pay | Admitting: Family Medicine

## 2018-01-12 VITALS — BP 130/82 | HR 92 | Temp 98.2°F

## 2018-01-12 DIAGNOSIS — M25475 Effusion, left foot: Secondary | ICD-10-CM

## 2018-01-12 DIAGNOSIS — B353 Tinea pedis: Secondary | ICD-10-CM

## 2018-01-12 DIAGNOSIS — M79672 Pain in left foot: Secondary | ICD-10-CM

## 2018-01-12 DIAGNOSIS — R2689 Other abnormalities of gait and mobility: Secondary | ICD-10-CM

## 2018-01-12 DIAGNOSIS — B351 Tinea unguium: Secondary | ICD-10-CM

## 2018-01-12 MED ORDER — IBUPROFEN 800 MG PO TABS: 800.0000 mg | ORAL_TABLET | Freq: Three times a day (TID) | ORAL | 0 refills | Status: DC | PRN

## 2018-01-12 MED ORDER — SULFAMETHOXAZOLE-TRIMETHOPRIM 800-160 MG PO TABS: 1.0000 | ORAL_TABLET | Freq: Two times a day (BID) | ORAL | 0 refills | Status: DC

## 2018-01-12 MED ORDER — TERBINAFINE HCL 250 MG PO TABS: 250.0000 mg | ORAL_TABLET | Freq: Every day | ORAL | 0 refills | Status: DC

## 2018-01-12 NOTE — Patient Instructions (Addendum)
Start the antibiotic and oral antifungal medication as prescribed. Take the ibuprofen for pain. Do foot soaks. You may want to use [place] solution and this is over the counter. If not, you can try vinegar and water. You may also use an over the counter antifungal power or medication as well.   Follow up in 2 weeks.

## 2018-01-12 NOTE — Progress Notes (Signed)
   Subjective:    Patient ID: Juan Hodge, male    DOB: 04-Feb-1976, 42 y.o.   MRN: [id number]  HPI Chief Complaint  Patient presents with  . pinky toe    pinky toe pain, itching- being going on for a week, has a history of althetes foot   He is here with complaints of a one week history of left great toe pain, redness, swelling and tenderness. States he has a history of athlete's foot and now has broken skin between his 4th and 5 th toes. Unable to bear weight today. Symptoms are worsening.  He has been using neosporin and topical antifungal.   Denies fever, chills, N/V/D. No numbness, tingling or weakness.   Reviewed allergies, medications, past medical, surgical, family, and social history.   Review of Systems Pertinent positives and negatives in the history of present illness.     Objective:   Physical Exam BP 130/82   Pulse 92   Temp 98.2 F (36.8 C) (Oral)   Left foot with erythema, edema and severe TTP over 5th MTP joint. No midfoot pain, heel pain or tenderness. Squeeze test is negative. Left foot with normal sensation, warmth, pulses. Limited ROM of 4th and 5th toes.  Skin is macerated with yellowish drainage between 4th and 5th toes. Yellowing and thickening of all nails on left foot.  Normal left ankle exam.  Antalgic gait.       Assessment & Plan:  Left foot pain - Plan: DG Foot Complete Left, ibuprofen (ADVIL,MOTRIN) 800 MG tablet, sulfamethoxazole-trimethoprim (BACTRIM DS,SEPTRA DS) 800-160 MG tablet  Tinea pedis of left foot - Plan: terbinafine (LAMISIL) 250 MG tablet  Antalgic gait - Plan: DG Foot Complete Left  Onychomycosis of toenail - Plan: terbinafine (LAMISIL) 250 MG tablet  Swelling of foot joint, left - Plan: DG Foot Complete Left, sulfamethoxazole-trimethoprim (BACTRIM DS,SEPTRA DS) 800-160 MG tablet  Will send him for XR to rule out osteomyelitis and cover with antibiotic. Will also start him on oral antifungal. He will take ibuprofen and do  foot soaks as well. Follow up in 2 weeks.

## 2018-01-26 ENCOUNTER — Ambulatory Visit: Payer: Self-pay | Admitting: Family Medicine

## 2018-02-08 ENCOUNTER — Ambulatory Visit (INDEPENDENT_AMBULATORY_CARE_PROVIDER_SITE_OTHER): Payer: Self-pay | Admitting: Family Medicine

## 2018-02-08 ENCOUNTER — Encounter: Payer: Self-pay | Admitting: Family Medicine

## 2018-02-08 VITALS — BP 152/108 | HR 88 | Temp 99.2°F | Resp 20 | Ht 70.0 in | Wt 216.0 lb

## 2018-02-08 DIAGNOSIS — H1031 Unspecified acute conjunctivitis, right eye: Secondary | ICD-10-CM

## 2018-02-08 DIAGNOSIS — I10 Essential (primary) hypertension: Secondary | ICD-10-CM

## 2018-02-08 MED ORDER — POLYMYXIN B-TRIMETHOPRIM 10000-0.1 UNIT/ML-% OP SOLN: 1.0000 [drp] | OPHTHALMIC | 0 refills | Status: DC

## 2018-02-08 NOTE — Progress Notes (Signed)
Subjective:  Juan Hodge is a 42 y.o. male who presents for a 10 day history of right eye itching, redness, and drainage. Denies eye pain, foreign body sensation or vision changes.   Denies fever, chills, dizziness, headache, chest pain, palpitations, shortness of breath, abdominal pain, N/VD.   Treatment to date: unknown eye drops.  Denies sick contacts.  No other aggravating or relieving factors.  No other c/o.  Aware his BP is elevated. States he checks his BP at home and it is never as high as today. He stopped his metoprolol because he does not "want to take medication".   ROS as in subjective.   Objective: Vitals:   02/08/18 1602  BP: (!) 152/108  Pulse: 88  Resp: 20  Temp: 99.2 F (37.3 C)    General appearance: Alert, WD/WN, no distress, mildly ill appearing                             Skin: warm, no rash                           Head: no sinus tenderness                            Eyes: right conjunctiva injected with clear drainage, left conjunctiva normal, corneas clear, PERRLA, EOMs intact                            Ears: pearly TMs, external ear canals normal                          Nose: septum midline, turbinates swollen, with erythema and clear discharge             Mouth/throat: MMM, tongue normal, mild pharyngeal erythema                           Neck: supple, no adenopathy, no thyromegaly, nontender                          Heart: RRR, normal S1, S2, no murmurs                         Lungs: CTA bilaterally, no wheezes, rales, or rhonchi      Assessment: Acute conjunctivitis of right eye, unspecified acute conjunctivitis type - Plan: trimethoprim-polymyxin b (POLYTRIM) ophthalmic solution  Essential hypertension    Plan: Discussed diagnosis and treatment of acute conjunctivitis. No red flags.  Polytrim prescribed.  Discussed good hand hygiene.  Discussed that his BP is significantly elevated and advised him to start back on metoprolol. Discussed  that we really should add a second medication but he is not in favor.  He will check his BP at home and return in 2 weeks with his BP machine and readings.

## 2018-02-08 NOTE — Patient Instructions (Signed)
Your BP today is 152/108. This is seriously elevated and I recommend that you start back on the metoprolol. Check your blood pressure at home and bring in your BP machine and readings in 2-3 weeks.   Use the eye drops as discussed for the next week. Cool compresses. Continue Zyrtec.  Wash your hands after touching your eyes.     Bacterial Conjunctivitis Bacterial conjunctivitis is an infection of your conjunctiva. This is the clear membrane that covers the white part of your eye and the inner surface of your eyelid. This condition can make your eye:  Red or pink.  Itchy.  This condition is caused by bacteria. This condition spreads very easily from person to person (is contagious) and from one eye to the other eye. Follow these instructions at home: Medicines  Take or apply your antibiotic medicine as told by your doctor. Do not stop taking or applying the antibiotic even if you start to feel better.  Take or apply over-the-counter and prescription medicines only as told by your doctor.  Do not touch your eyelid with the eye drop bottle or the ointment tube. Managing discomfort  Wipe any fluid from your eye with a warm, wet washcloth or a cotton ball.  Place a cool, clean washcloth on your eye. Do this for 10-20 minutes, 3-4 times per day. General instructions  Do not wear contact lenses until the irritation is gone. Wear glasses until your doctor says it is okay to wear contacts.  Do not wear eye makeup until your symptoms are gone. Throw away any old makeup.  Change or wash your pillowcase every day.  Do not share towels or washcloths with anyone.  Wash your hands often with soap and water. Use paper towels to dry your hands.  Do not touch or rub your eyes.  Do not drive or use heavy machinery if your vision is blurry. Contact a doctor if:  You have a fever.  Your symptoms do not get better after 10 days. Get help right away if:  You have a fever and your symptoms  suddenly get worse.  You have very bad pain when you move your eye.  Your face: ? Hurts. ? Is red. ? Is swollen.  You have sudden loss of vision. This information is not intended to replace advice given to you by your health care provider. Make sure you discuss any questions you have with your health care provider. Document Released: 03/02/2008 Document Revised: 10/30/2015 Document Reviewed: 03/06/2015 Elsevier Interactive Patient Education  [employer].

## 2018-05-08 ENCOUNTER — Ambulatory Visit (INDEPENDENT_AMBULATORY_CARE_PROVIDER_SITE_OTHER): Payer: Self-pay | Admitting: Medical

## 2018-05-08 ENCOUNTER — Encounter: Payer: Self-pay | Admitting: Medical

## 2018-05-08 VITALS — BP 140/90 | HR 80 | Temp 98.2°F | Resp 16 | Ht 70.0 in | Wt 218.8 lb

## 2018-05-08 DIAGNOSIS — I10 Essential (primary) hypertension: Secondary | ICD-10-CM | POA: Insufficient documentation

## 2018-05-08 DIAGNOSIS — H1031 Unspecified acute conjunctivitis, right eye: Secondary | ICD-10-CM

## 2018-05-08 DIAGNOSIS — Z9119 Patient's noncompliance with other medical treatment and regimen: Secondary | ICD-10-CM

## 2018-05-08 DIAGNOSIS — Z91199 Patient's noncompliance with other medical treatment and regimen due to unspecified reason: Secondary | ICD-10-CM

## 2018-05-08 MED ORDER — METOPROLOL TARTRATE 25 MG PO TABS: 25.0000 mg | ORAL_TABLET | Freq: Two times a day (BID) | ORAL | 2 refills | Status: DC

## 2018-05-08 MED ORDER — ERYTHROMYCIN 5 MG/GM OP OINT: 1.0000 "application " | TOPICAL_OINTMENT | Freq: Every day | OPHTHALMIC | 0 refills | Status: DC

## 2018-05-08 NOTE — Progress Notes (Signed)
 Harrold DonathMadelaine BhatDoreene Burke[address] RoadCox Medical Centers Meyer OrthopedicMercy St. Francis [phone] DelUnion Hospital Of Cecil County Geographical information systems officerOdis Luster46mo Harrold DonathMadelaine BhatDoreene Burke[address]Ohio County HospitalMorton Hospital And Medical [phone]  Patient was advised to call or return if worse or not improving in the next few days.    Patient voiced understanding of diagnosis, recommendations, and treatment plan.   HTN, noncompliance - restart lopressors, discussed importance of compliance, risks of HTN.   F/u with Vickie NP in a month for recheck  Hawkins was seen today for eye issue.  Diagnoses and all orders for this visit:  Acute conjunctivitis of right eye, unspecified acute conjunctivitis type  Essential hypertension, benign  Noncompliance  Other orders -     erythromycin ophthalmic ointment; Place 1 application into the right eye at bedtime. -     metoprolol tartrate (LOPRESSOR) 25 MG  tablet; Take 1 tablet (25 mg total) by mouth 2 (two) times daily. 1 tablet po BID for blood pressure

## 2018-06-09 ENCOUNTER — Ambulatory Visit: Payer: Self-pay | Admitting: Family Medicine

## 2018-06-09 ENCOUNTER — Encounter: Payer: Self-pay | Admitting: Family Medicine

## 2018-06-09 VITALS — BP 138/88 | HR 129 | Ht 70.0 in | Wt 220.0 lb

## 2018-06-09 DIAGNOSIS — R509 Fever, unspecified: Secondary | ICD-10-CM

## 2018-06-09 DIAGNOSIS — J101 Influenza due to other identified influenza virus with other respiratory manifestations: Secondary | ICD-10-CM

## 2018-06-09 DIAGNOSIS — R6889 Other general symptoms and signs: Secondary | ICD-10-CM

## 2018-06-09 LAB — POC INFLUENZA A&B (BINAX/QUICKVUE)
INFLUENZA A, POC: POSITIVE — AB
INFLUENZA B, POC: NEGATIVE

## 2018-06-09 MED ORDER — OSELTAMIVIR PHOSPHATE 75 MG PO CAPS: 75.0000 mg | ORAL_CAPSULE | Freq: Two times a day (BID) | ORAL | 0 refills | Status: DC

## 2018-06-09 NOTE — Progress Notes (Signed)
Acute Office Visit  Subjective:    Patient ID: Juan Hodge, male    DOB: November 21, 1975, 43 y.o.   MRN: [id number]  Chief Complaint  Patient presents with  . Influenza    HPI Patient is in today for a 24 hour history of fever, chills, body aches, headache, and cough productive of yellowish sputum. No known sick contacts.  Denies sore throat, ear pain, chest pain, palpitations, wheezing, abdominal pain, N/V/D, urinary symptoms.  He has taken ibuprofen 800 mg but not since last night.   Did not get a flu shot.    Past Medical History:  Diagnosis Date  . Alcohol abuse, daily use   . Anxiety   . Asthma   . GERD (gastroesophageal reflux disease)   . Panic attacks   . Substance abuse [facility])     Past Surgical History:  Procedure Laterality Date  . tubes in ears     as a child    Family History  Problem Relation Age of Onset  . Heart disease Father 43       died of cardiac arrest 56    Social History   Socioeconomic History  . Marital status: Single    Spouse name: Not on file  . Number of children: 0  . Years of education: Not on file  . Highest education level: Not on file  Occupational History  . Occupation: Chief Strategy Officer  . Financial resource strain: Not on file  . Food insecurity:    Worry: Not on file    Inability: Not on file  . Transportation needs:    Medical: Not on file    Non-medical: Not on file  Tobacco Use  . Smoking status: Never Smoker  . Smokeless tobacco: Never Used  Substance and Sexual Activity  . Alcohol use: Yes    Alcohol/week: 0.0 standard drinks    Comment: 2-4 glasses of wine. (1/2-1 bottle a night)  . Drug use: Yes    Types: Benzodiazepines    Comment: former user of marijuana and cocaine  . Sexual activity: Not on file  Lifestyle  . Physical activity:    Days per week: Not on file    Minutes per session: Not on file  . Stress: Not on file  Relationships  . Social connections:    Talks on phone: Not on file   Gets together: Not on file    Attends religious service: Not on file    Active member of club or organization: Not on file    Attends meetings of clubs or organizations: Not on file    Relationship status: Not on file  . Intimate partner violence:    Fear of current or ex partner: Not on file    Emotionally abused: Not on file    Physically abused: Not on file    Forced sexual activity: Not on file  Other Topics Concern  . Not on file  Social History Narrative  . Not on file    Outpatient Medications Prior to Visit  Medication Sig Dispense Refill  . ALPRAZolam (XANAX) 0.25 MG tablet Take 1 tablet (0.25 mg total) by mouth 2 (two) times daily as needed for anxiety. 20 tablet 0  . metoprolol tartrate (LOPRESSOR) 25 MG tablet Take 1 tablet (25 mg total) by mouth 2 (two) times daily. 1 tablet po BID for blood pressure 60 tablet 2  . erythromycin ophthalmic ointment Place 1 application into the right eye at bedtime. 3.5 g 0  .  ibuprofen (ADVIL,MOTRIN) 800 MG tablet Take 1 tablet (800 mg total) by mouth every 8 (eight) hours as needed. (Patient not taking: Reported on 02/08/2018) 30 tablet 0  . trimethoprim-polymyxin b (POLYTRIM) ophthalmic solution Place 1 drop into the right eye every 4 (four) hours. 10 mL 0   No facility-administered medications prior to visit.     Allergies  Allergen Reactions  . Erythromycin Other (See Comments)    Tears stomach up    ROS Pertinent positives and negatives in the history of present illness.     Objective:    Physical Exam  BP 138/88   Pulse (!) 129   Ht 5\' 10"  (1.778 m)   Wt 220 lb (99.8 kg)   SpO2 95%   BMI 31.57 kg/m  Wt Readings from Last 3 Encounters:  06/09/18 220 lb (99.8 kg)  05/08/18 218 lb 12.8 oz (99.2 kg)  02/08/18 216 lb (98 kg)    Health Maintenance Due  Topic Date Due  . TETANUS/TDAP  02/21/1995  . INFLUENZA VACCINE  01/05/2018    There are no preventive care reminders to display for this patient.   No results  found for: TSH Lab Results  Component Value Date   WBC 5.1 01/17/2017   HGB 16.1 01/17/2017   HCT 46.7 01/17/2017   MCV 87.5 01/17/2017   PLT 147 01/17/2017   Lab Results  Component Value Date   NA 139 01/17/2017   K 4.2 01/17/2017   CO2 24 01/17/2017   GLUCOSE 83 01/17/2017   BUN 12 01/17/2017   CREATININE 0.98 01/17/2017   BILITOT 0.9 01/17/2017   ALKPHOS 56 01/17/2017   AST 23 01/17/2017   ALT 33 01/17/2017   PROT 7.2 01/17/2017   ALBUMIN 4.3 01/17/2017   CALCIUM 9.9 01/17/2017   No results found for: CHOL No results found for: HDL No results found for: LDLCALC No results found for: TRIG No results found for: CHOLHDL No results found for: [id number]     Assessment & Plan:   Problem List Items Addressed This Visit    None    Visit Diagnoses    Influenza A    -  Primary   Relevant Medications   oseltamivir (TAMIFLU) 75 MG capsule   Flu-like symptoms       Relevant Orders   POC Influenza A&B(BINAX/QUICKVUE)   Fever, unspecified fever cause       Relevant Orders   POC Influenza A&B(BINAX/QUICKVUE)       Meds ordered this encounter  Medications  . oseltamivir (TAMIFLU) 75 MG capsule    Sig: Take 1 capsule (75 mg total) by mouth 2 (two) times daily.    Dispense:  10 capsule    Refill:  0    Order Specific Question:   Supervising Provider    Answer:   Ronnald NianLALONDE, JOHN C [[id number]]   Discussed potential complications from flu and when to seek immediate medical attention. He will take Tamiflu and if he has any worrisome side effects, will stop and call.   Hetty BlendVickie Lamica Mccart, NP-C

## 2018-06-09 NOTE — Patient Instructions (Addendum)
Hydrate and rest. Take Tylenol or Ibuprofen 800 mg three times daily with food. Take the Tamiflu as prescribed and if you have any serious side effects of if you are not improving then you should be seen over the weekend.    Oseltamivir capsules What is this medicine? OSELTAMIVIR (os el TAM i vir) is an antiviral medicine. It is used to prevent and to treat some kinds of influenza or the flu. It will not work for colds or other viral infections. This medicine may be used for other purposes; ask your health care provider or pharmacist if you have questions. COMMON BRAND NAME(S): Tamiflu What should I tell my health care provider before I take this medicine? They need to know if you have any of the following conditions: -heart disease -immune system problems -kidney disease -liver disease -lung disease -an unusual or allergic reaction to oseltamivir, other medicines, foods, dyes, or preservatives -pregnant or trying to get pregnant -breast-feeding How should I use this medicine? Take this medicine by mouth with a glass of water. Follow the directions on the prescription label. Start this medicine at the first sign of flu symptoms. You can take it with or without food. If it upsets your stomach, take it with food. Take your medicine at regular intervals. Do not take your medicine more often than directed. Take all of your medicine as directed even if you think you are better. Do not skip doses or stop your medicine early. Talk to your pediatrician regarding the use of this medicine in children. While this drug may be prescribed for children as young as 14 days for selected conditions, precautions do apply. Overdosage: If you think you have taken too much of this medicine contact a poison control center or emergency room at once. NOTE: This medicine is only for you. Do not share this medicine with others. What if I miss a dose? If you miss a dose, take it as soon as you remember. If it is almost  time for your next dose (within 2 hours), take only that dose. Do not take double or extra doses. What may interact with this medicine? - intranasal influenza vaccine This list may not describe all possible interactions. Give your health care provider a list of all the medicines, herbs, non-prescription drugs, or dietary supplements you use. Also tell them if you smoke, drink alcohol, or use illegal drugs. Some items may interact with your medicine. What should I watch for while using this medicine? Visit your doctor or health care professional for regular check ups. Tell your doctor if your symptoms do not start to get better or if they get worse. If you have the flu, you may be at an increased risk of developing seizures, confusion, or abnormal behavior. This occurs early in the illness, and more frequently in children and teens. These events are not common, but may result in accidental injury to the patient. Families and caregivers of patients should watch for signs of unusual behavior and contact a doctor or health care professional right away if the patient shows signs of unusual behavior. This medicine is not a substitute for the flu shot. Talk to your doctor each year about an annual flu shot. What side effects may I notice from receiving this medicine? Side effects that you should report to your doctor or health care professional as soon as possible: -allergic reactions like skin rash, itching or hives, swelling of the face, lips, or tongue -anxiety, confusion, unusual behavior -breathing problems -hallucination,  loss of contact with reality -redness, blistering, peeling or loosening of the skin, including inside the mouth -seizures Side effects that usually do not require medical attention (report to your doctor or health care professional if they continue or are bothersome): -diarrhea -headache -nausea, vomiting -pain This list may not describe all possible side effects. Call your  doctor for medical advice about side effects. You may report side effects to FDA at 1-800-FDA-1088. Where should I keep my medicine? Keep out of the reach of children. Store at room temperature between 15 and 30 degrees C (59 and 86 degrees F). Throw away any unused medicine after the expiration date. NOTE: This sheet is a summary. It may not cover all possible information. If you have questions about this medicine, talk to your doctor, pharmacist, or health care provider.  2019 Elsevier/Gold Standard (2016-11-03 16:45:14)   Influenza, Adult Influenza is also called "the flu." It is an infection in the lungs, nose, and throat (respiratory tract). It is caused by a virus. The flu causes symptoms that are similar to symptoms of a cold. It also causes a high fever and body aches. The flu spreads easily from person to person (is contagious). Getting a flu shot (influenza vaccination) every year is the best way to prevent the flu. What are the causes? This condition is caused by the influenza virus. You can get the virus by:  Breathing in droplets that are in the air from the cough or sneeze of a person who has the virus.  Touching something that has the virus on it (is contaminated) and then touching your mouth, nose, or eyes. What increases the risk? Certain things may make you more likely to get the flu. These include:  Not washing your hands often.  Having close contact with many people during cold and flu season.  Touching your mouth, eyes, or nose without first washing your hands.  Not getting a flu shot every year. You may have a higher risk for the flu, along with serious problems such as a lung infection (pneumonia), if you:  Are older than 65.  Are pregnant.  Have a weakened disease-fighting system (immune system) because of a disease or taking certain medicines.  Have a long-term (chronic) illness, such as: ? Heart, kidney, or lung disease. ? Diabetes. ? Asthma.  Have a  liver disorder.  Are very overweight (morbidly obese).  Have anemia. This is a condition that affects your red blood cells. What are the signs or symptoms? Symptoms usually begin suddenly and last 4-14 days. They may include:  Fever and chills.  Headaches, body aches, or muscle aches.  Sore throat.  Cough.  Runny or stuffy (congested) nose.  Chest discomfort.  Not wanting to eat as much as normal (poor appetite).  Weakness or feeling tired (fatigue).  Dizziness.  Feeling sick to your stomach (nauseous) or throwing up (vomiting). How is this treated? If the flu is found early, you can be treated with medicine that can help reduce how bad the illness is and how long it lasts (antiviral medicine). This may be given by mouth (orally) or through an IV tube. Taking care of yourself at home can help your symptoms get better. Your doctor may suggest:  Taking over-the-counter medicines.  Drinking plenty of fluids. The flu often goes away on its own. If you have very bad symptoms or other problems, you may be treated in a hospital. Follow these instructions at home:     Activity  Rest as  needed. Get plenty of sleep.  Stay home from work or school as told by your doctor. ? Do not leave home until you do not have a fever for 24 hours without taking medicine. ? Leave home only to visit your doctor. Eating and drinking  Take an ORS (oral rehydration solution). This is a drink that is sold at pharmacies and stores.  Drink enough fluid to keep your pee (urine) pale yellow.  Drink clear fluids in small amounts as you are able. Clear fluids include: ? Water. ? Ice chips. ? Fruit juice that has water added (diluted fruit juice). ? Low-calorie sports drinks.  Eat bland, easy-to-digest foods in small amounts as you are able. These foods include: ? Bananas. ? Applesauce. ? Rice. ? Lean meats. ? Toast. ? Crackers.  Do not eat or drink: ? Fluids that have a lot of sugar or  caffeine. ? Alcohol. ? Spicy or fatty foods. General instructions  Take over-the-counter and prescription medicines only as told by your doctor.  Use a cool mist humidifier to add moisture to the air in your home. This can make it easier for you to breathe.  Cover your mouth and nose when you cough or sneeze.  Wash your hands with soap and water often, especially after you cough or sneeze. If you cannot use soap and water, use alcohol-based hand sanitizer.  Keep all follow-up visits as told by your doctor. This is important. How is this prevented?   Get a flu shot every year. You may get the flu shot in late summer, fall, or winter. Ask your doctor when you should get your flu shot.  Avoid contact with people who are sick during fall and winter (cold and flu season). Contact a doctor if:  You get new symptoms.  You have: ? Chest pain. ? Watery poop (diarrhea). ? A fever.  Your cough gets worse.  You start to have more mucus.  You feel sick to your stomach.  You throw up. Get help right away if you:  Have shortness of breath.  Have trouble breathing.  Have skin or nails that turn a bluish color.  Have very bad pain or stiffness in your neck.  Get a sudden headache.  Get sudden pain in your face or ear.  Cannot eat or drink without throwing up. Summary  Influenza ("the flu") is an infection in the lungs, nose, and throat. It is caused by a virus.  Take over-the-counter and prescription medicines only as told by your doctor.  Getting a flu shot every year is the best way to avoid getting the flu. This information is not intended to replace advice given to you by your health care provider. Make sure you discuss any questions you have with your health care provider. Document Released: 03/02/2008 Document Revised: 11/09/2017 Document Reviewed: 11/09/2017 Elsevier Interactive Patient Education  2019 [employer]

## 2018-07-01 ENCOUNTER — Other Ambulatory Visit: Payer: Self-pay | Admitting: Medical

## 2018-07-18 ENCOUNTER — Encounter: Payer: Self-pay | Admitting: Family Medicine

## 2018-07-18 ENCOUNTER — Ambulatory Visit (INDEPENDENT_AMBULATORY_CARE_PROVIDER_SITE_OTHER): Payer: Self-pay | Admitting: Family Medicine

## 2018-07-18 VITALS — BP 140/94 | HR 76 | Temp 98.4°F | Resp 16 | Wt 216.6 lb

## 2018-07-18 DIAGNOSIS — R058 Other specified cough: Secondary | ICD-10-CM

## 2018-07-18 DIAGNOSIS — F419 Anxiety disorder, unspecified: Secondary | ICD-10-CM

## 2018-07-18 DIAGNOSIS — I10 Essential (primary) hypertension: Secondary | ICD-10-CM

## 2018-07-18 DIAGNOSIS — J069 Acute upper respiratory infection, unspecified: Secondary | ICD-10-CM

## 2018-07-18 DIAGNOSIS — R05 Cough: Secondary | ICD-10-CM

## 2018-07-18 MED ORDER — ALPRAZOLAM 0.25 MG PO TABS: 0.2500 mg | ORAL_TABLET | Freq: Two times a day (BID) | ORAL | 0 refills | Status: DC | PRN

## 2018-07-18 MED ORDER — AMOXICILLIN-POT CLAVULANATE 875-125 MG PO TABS: 1.0000 | ORAL_TABLET | Freq: Two times a day (BID) | ORAL | 0 refills | Status: DC

## 2018-07-18 NOTE — Progress Notes (Signed)
Chief Complaint  Patient presents with  . sinus infection    sinus for last 3 days, some sore thoart at beginning, cough- no fever, phelgm greenish [place].     Subjective:  Juan Hodge is a 43 y.o. male who presents for a one week history of sinus pressure, nasal congestion, post nasal drainage, chest congestion, productive cough worse in the morning.   Denies fever, chills, body aches, ear pain, chest pain, palpitations, shortness of breath, wheezing, abdominal pain, N/V/D.   Treatment to date: none.  Positive sick contacts.  No other aggravating or relieving factors.    States BP at home is always in goal range but then he asked what goal range is actually.  He stopped taking his metoprolol months ago. Thinks he does not need it.   Leaving tomorrow for a vacation in [place]. Anxiety increased and requests refill of Xanax. He only take this prn and not on a regular basis.  Alcohol use is limited now per patient. Only drinks on some weekends.  Plans to see his counselor.   ROS as in subjective.   Objective: Vitals:   07/18/18 1130  BP: (!) 140/94  Pulse: 76  Resp: 16  Temp: 98.4 F (36.9 C)  SpO2: 97%    General appearance: Alert, WD/WN, no distress, mildly ill appearing                             Skin: warm, no rash                           Head: no sinus tenderness                            Eyes: conjunctiva normal, corneas clear, PERRLA                            Ears: pearly TMs, external ear canals normal                          Nose: septum midline, turbinates swollen L>R, with erythema and clear discharge             Mouth/throat: MMM, tongue normal, mild pharyngeal erythema, no edema                           Neck: supple, no adenopathy, no thyromegaly, nontender                          Heart: RRR, normal S1, S2, no murmurs                         Lungs: CTA bilaterally, no wheezes, rales, or rhonchi      Assessment: Productive cough - Plan:  amoxicillin-clavulanate (AUGMENTIN) 875-125 MG tablet  Acute URI  Anxiety - Plan: ALPRAZolam (XANAX) 0.25 MG tablet  Essential hypertension, benign    Plan: Discussed diagnosis and treatment of URI and productive cough.  Augmentin prescribed.  Counseling on viral versus bacterial illnesses.  He does appear to have a course of illness suggesting bacterial infection.  Suggested symptomatic OTC remedies.Nasal saline spray for congestion.  Tylenol or Ibuprofen OTC for fever and malaise.  Counseling on uncontrolled hypertension.  He has stopped his blood pressure medication.  Discussed that if his blood pressure at home is not in goal range that he should start back on this medication.  Discussed potential long-term health consequences associated with uncontrolled hypertension. Anxiety-reports increased anxiety and [place]  I will refill Xanax.  He is aware that this is not for daily use and that he should use this sparingly.  His alcohol intake appears to have reduced.  Recommend he continue with counseling.

## 2018-07-18 NOTE — Patient Instructions (Signed)
Take the antibiotic as prescribed. Stay well hydrated. You may also want to take Mucinex for cough and congestion. You can use Afrin on your flight for nasal congestion but no more than 2 days in a row.   Your BP today is 140/94 and is uncontrolled. Goal blood pressure range is less than 130/80. If you are seeing readings higher than this at home then you should start back on your blood pressure medication.   Do not drink alcohol with the Xanax. Consider or continue counseling.

## 2018-11-03 ENCOUNTER — Telehealth: Payer: Self-pay | Admitting: Internal Medicine

## 2018-11-03 NOTE — Telephone Encounter (Signed)
Pt is due for cpe but will call at a later time to schedule. Does not want to come in during this time

## 2019-03-13 ENCOUNTER — Other Ambulatory Visit: Payer: Self-pay

## 2019-03-13 DIAGNOSIS — Z20822 Contact with and (suspected) exposure to covid-19: Secondary | ICD-10-CM

## 2019-03-15 LAB — NOVEL CORONAVIRUS, NAA: SARS-CoV-2, NAA: NOT DETECTED

## 2019-03-16 ENCOUNTER — Ambulatory Visit (INDEPENDENT_AMBULATORY_CARE_PROVIDER_SITE_OTHER): Payer: Self-pay | Admitting: Family Medicine

## 2019-03-16 ENCOUNTER — Encounter: Payer: Self-pay | Admitting: Family Medicine

## 2019-03-16 ENCOUNTER — Other Ambulatory Visit: Payer: Self-pay

## 2019-03-16 VITALS — Temp 97.8°F | Wt 200.0 lb

## 2019-03-16 DIAGNOSIS — R5383 Other fatigue: Secondary | ICD-10-CM

## 2019-03-16 DIAGNOSIS — R519 Headache, unspecified: Secondary | ICD-10-CM

## 2019-03-16 NOTE — Progress Notes (Signed)
   Subjective:  Documentation for virtual audio and video telecommunications through [place] encounter:  The patient was located at home. 2 patient identifiers used.  The provider was located in the office. The patient did consent to this visit and is aware of possible charges through their insurance for this visit.  The other persons participating in this telemedicine service were none.    Patient ID: Juan Hodge, male    DOB: Jul 18, 1975, 43 y.o.   MRN: [id number]  HPI Chief Complaint  Patient presents with  . sick    headaches and diarrhea, since satruday, no SOB, no Fever,   Complains of a 7 day history of headache. States he thought it was related to drinking too much alcohol the night before.  Felt "flu-like". Severe fatigue. Fever started mid week.  States he got tested for Covid-19 3 days ago and it was negative.   Has underlying allergies and is treating these.   Recent travel to [place].   States he took Advil yesterday and he got some relief.   He has been hydrating and no alcohol in the past 3 days. He has had headaches in the past when he stops alcohol for a few days.   Denies fever, chills, dizziness, vision changes, chest pain, palpitations, shortness of breath, abdominal pain, N/V/D, urinary symptoms, LE edema.     Review of Systems Pertinent positives and negatives in the history of present illness.      Objective:   Physical Exam Temp 97.8 F (36.6 C)   Wt 200 lb (90.7 kg)   BMI 28.70 kg/m   Alert and oriented and in no acute distress.  Respirations unlabored.  No facial asymmetry.  Moving all extremities without difficulty.  Normal speech, mood and thought process.      Assessment & Plan:  Acute nonintractable headache, unspecified headache type  Fatigue, unspecified type  No red flag symptoms.  Negative Covid-19 test.  Symptoms improving. Headache still lingering.  Continue with supportive care, rest, hydrate and try Excedrin or  tylenol for headache.  Avoid alcohol.  Follow up if not improving or if he gets worse.   Time spent on call was 14 minutes and in review of previous records 1 minutes total.  This virtual service is not related to other E/M service within previous 7 days.

## 2019-04-10 ENCOUNTER — Other Ambulatory Visit: Payer: Self-pay

## 2019-04-10 DIAGNOSIS — Z20822 Contact with and (suspected) exposure to covid-19: Secondary | ICD-10-CM

## 2019-04-11 LAB — NOVEL CORONAVIRUS, NAA: SARS-CoV-2, NAA: NOT DETECTED

## 2019-05-11 ENCOUNTER — Other Ambulatory Visit: Payer: Self-pay

## 2019-05-11 DIAGNOSIS — Z20822 Contact with and (suspected) exposure to covid-19: Secondary | ICD-10-CM

## 2019-05-14 ENCOUNTER — Telehealth: Payer: Self-pay | Admitting: Family Medicine

## 2019-05-14 LAB — NOVEL CORONAVIRUS, NAA: SARS-CoV-2, NAA: NOT DETECTED

## 2019-05-14 NOTE — Telephone Encounter (Signed)
Pt called and is asking for  A referral states his eye stays itchy and leaks water all the time, states he has been seen for this several times and it come back about once a month, pt can be reached at [phone]

## 2019-05-14 NOTE — Telephone Encounter (Signed)
It sounds as though he is asking for a referral to an eye doctor? Please check and let him him know that we can try to get him in with someone or he is welcome to call based on a list of eye doctors that we can provide him.

## 2019-05-14 NOTE — Telephone Encounter (Signed)
Pt was given a list of eye doctors to call to schedule

## 2019-10-01 ENCOUNTER — Telehealth (INDEPENDENT_AMBULATORY_CARE_PROVIDER_SITE_OTHER): Payer: Self-pay | Admitting: Family Medicine

## 2019-10-01 ENCOUNTER — Encounter: Payer: Self-pay | Admitting: Family Medicine

## 2019-10-01 ENCOUNTER — Other Ambulatory Visit: Payer: Self-pay

## 2019-10-01 VITALS — Temp 97.5°F | Wt 200.0 lb

## 2019-10-01 DIAGNOSIS — R509 Fever, unspecified: Secondary | ICD-10-CM

## 2019-10-01 DIAGNOSIS — R519 Headache, unspecified: Secondary | ICD-10-CM

## 2019-10-01 DIAGNOSIS — R5383 Other fatigue: Secondary | ICD-10-CM

## 2019-10-01 NOTE — Progress Notes (Signed)
   Subjective:  Documentation for virtual audio through [facility] encounter. He could not get his iphone to work for video.   The patient was located at home. 2 patient identifiers used.  The provider was located in the office. The patient did consent to this visit and is aware of possible charges through their insurance for this visit.  The other persons participating in this telemedicine service were none. Time spent on call was 15 minutes and in review of previous records 15 minutes total.  This virtual service is not related to other E/M service within previous 7 days.   Patient ID: Juan Hodge, male    DOB: [birth year], 44 y.o.   MRN: [id number]  HPI Chief Complaint  Patient presents with  . vaccine reaction    got vaccine thursday but started feeling bad on saturday- dehyrdated, fever, headache, fatigue, urine frequency (8 or 9 glasses)   Complains of fever, chills, body aches, fatigue, and the "worst headache" of his life since yesterday.   States he received his first [employer] Covid vaccine 2 days prior to onset of symptoms. States a few hours later his arm was sore. He felt lethargic the next day. States he drank alcohol that night. He woke up feeling very bad yesterday.  States he felt "flu-like".   States his backache is gone today. Headache is equal to the worse headache of his life.  Taking Advil 800 mg eery 8 hours and it helps his headache but it does not go away.  Urinary frequency but has been "pushing fluids". No urgency, dysuria or other urinary symptoms.   Denies chest pain, palpitations, shortness of breath, abdominal pain, N/V/D. No focal weakness.   Reviewed allergies, medications, past medical, surgical, family, and social history.   Review of Systems Pertinent positives and negatives in the history of present illness.     Objective:   Physical Exam Temp (!) 97.5 F (36.4 C)   Wt 200 lb (90.7 kg)   BMI 28.70 kg/m   Alert and oriented.  Unable to  visualize him due to his iPhone not working properly.  Voice is normal sounding.  Speaking in complete sentences at difficulty.      Assessment & Plan:  Severe headache  Fever and chills  Fatigue, unspecified type  Discussed that the symptoms are ongoing for more than 36 hours from his Covid vaccine.  I recommend that he get a Covid test and a walked him through how to text [facility] to get this set up. Discussed that he reports the worst headache of his life and that I recommend he go to the emergency department for evaluation.  He then said perhaps the headache is not the most severe but is definitely one of the worst.  Discussed that he may need imaging and evaluation for his headache if it persists or he has any new symptoms.  He verbalizes understanding but is not in favor of going to the ED at this time. Discussed that his frequent urination is most likely due to increased fluids.  Discussed symptomatic treatment.  He will follow-up with Covid result or sooner if needed.

## 2019-12-06 IMAGING — CR DG FOOT COMPLETE 3+V*L*
3 series · 3 of 3 positions shown · non-contrast
Comparison: None

CLINICAL DATA: Acute pain between the fourth and fifth phalanges
for 1 week, soft tissue wound in webbing secondary to athlete's foot
sore, concern for osteomyelitis

EXAM:
LEFT FOOT - COMPLETE 3+ VIEW

[x foot ap left]
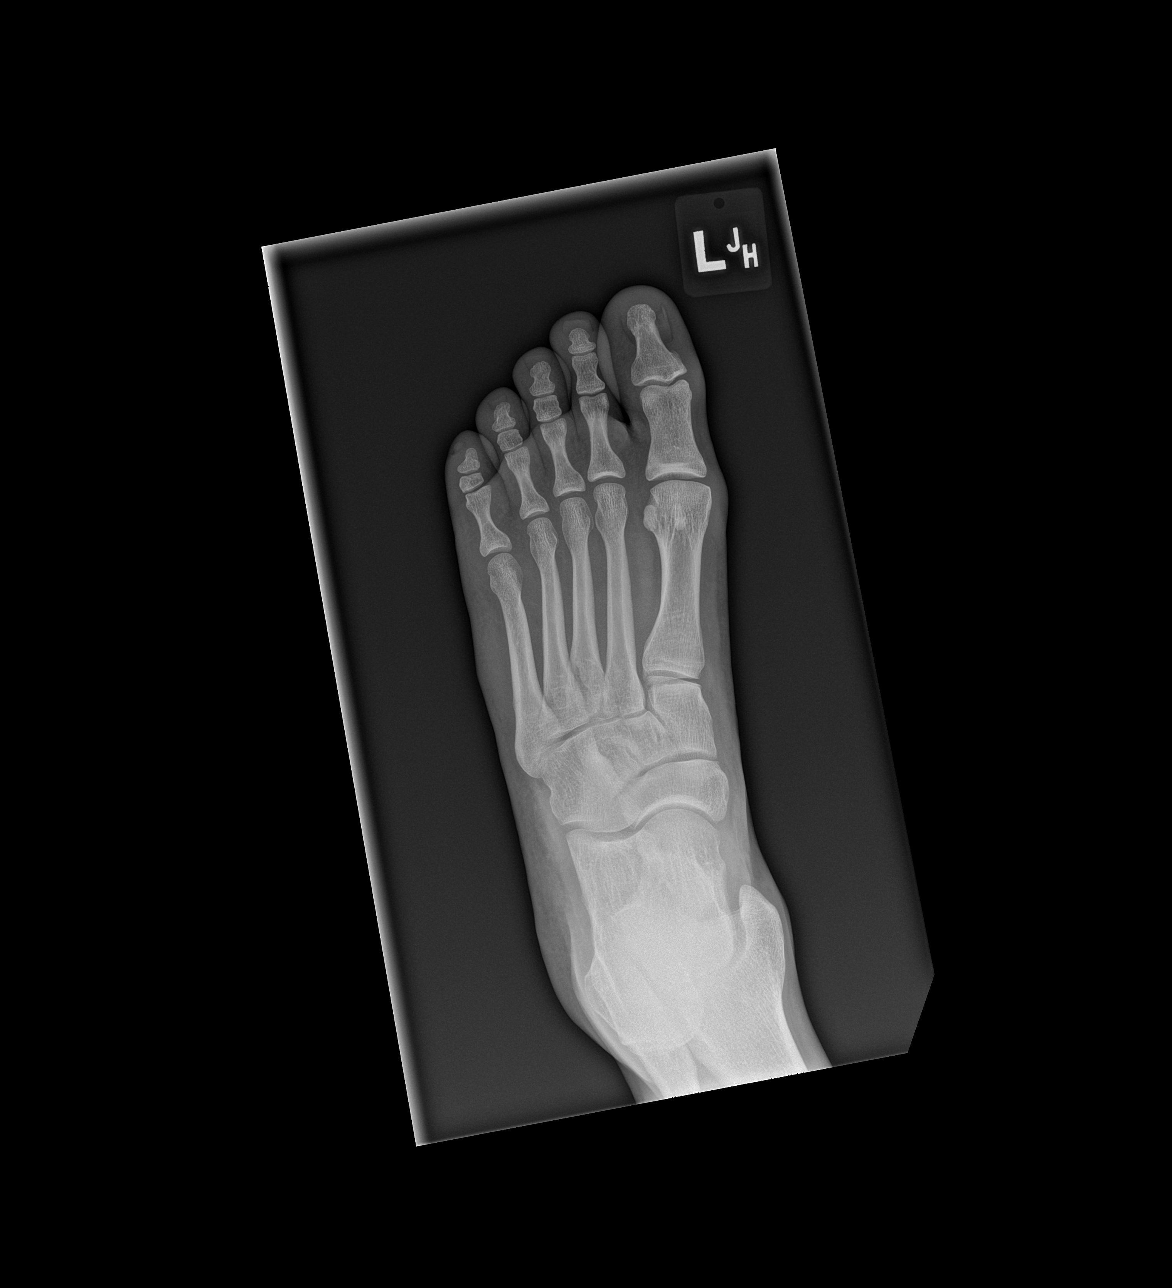

[x foot obl left]
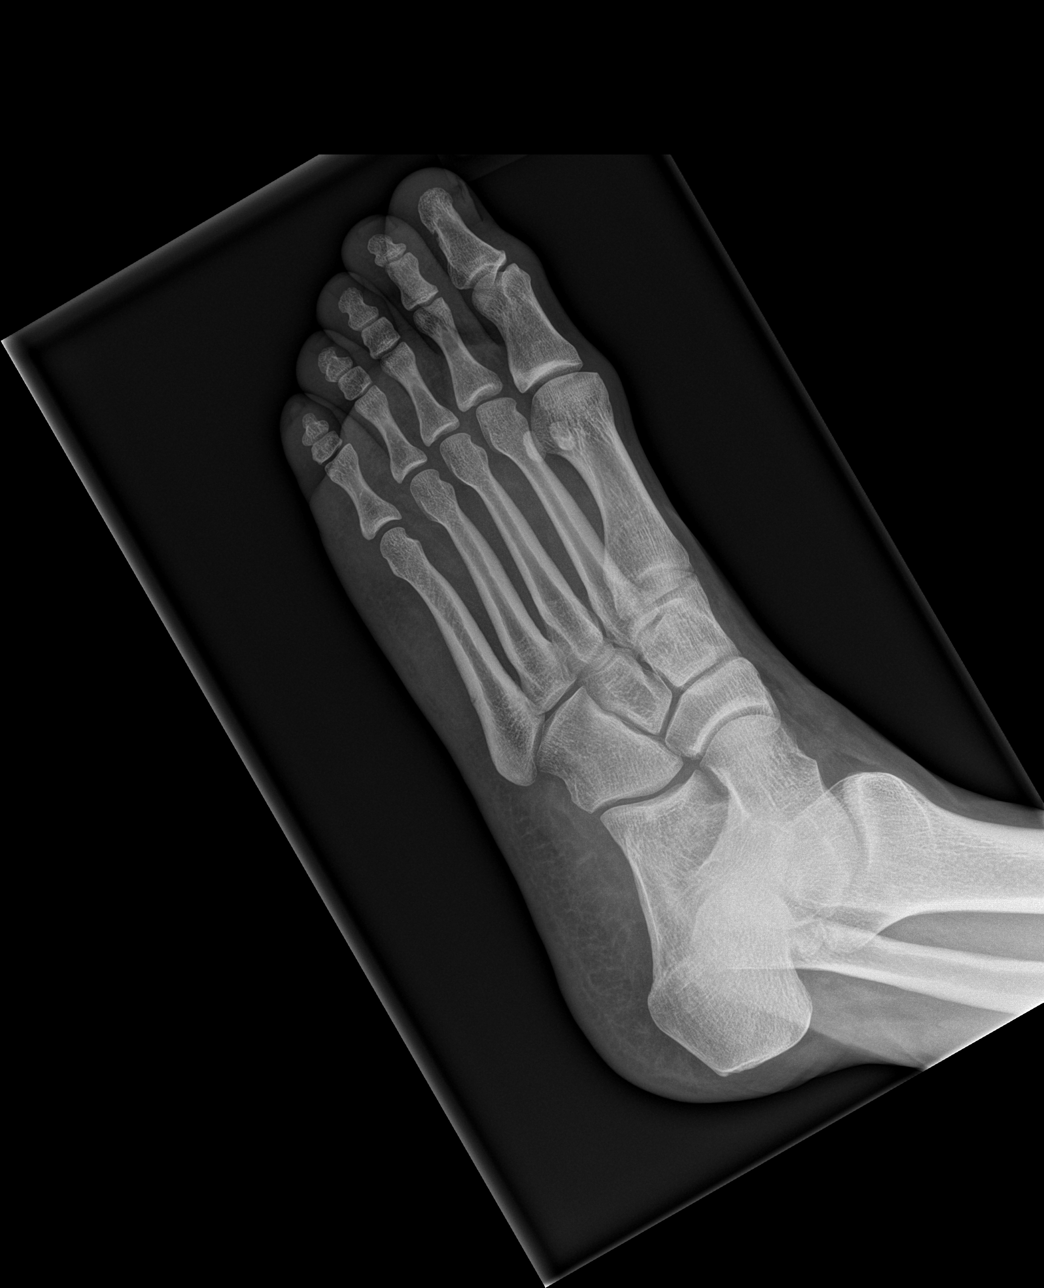

[x foot lat left]
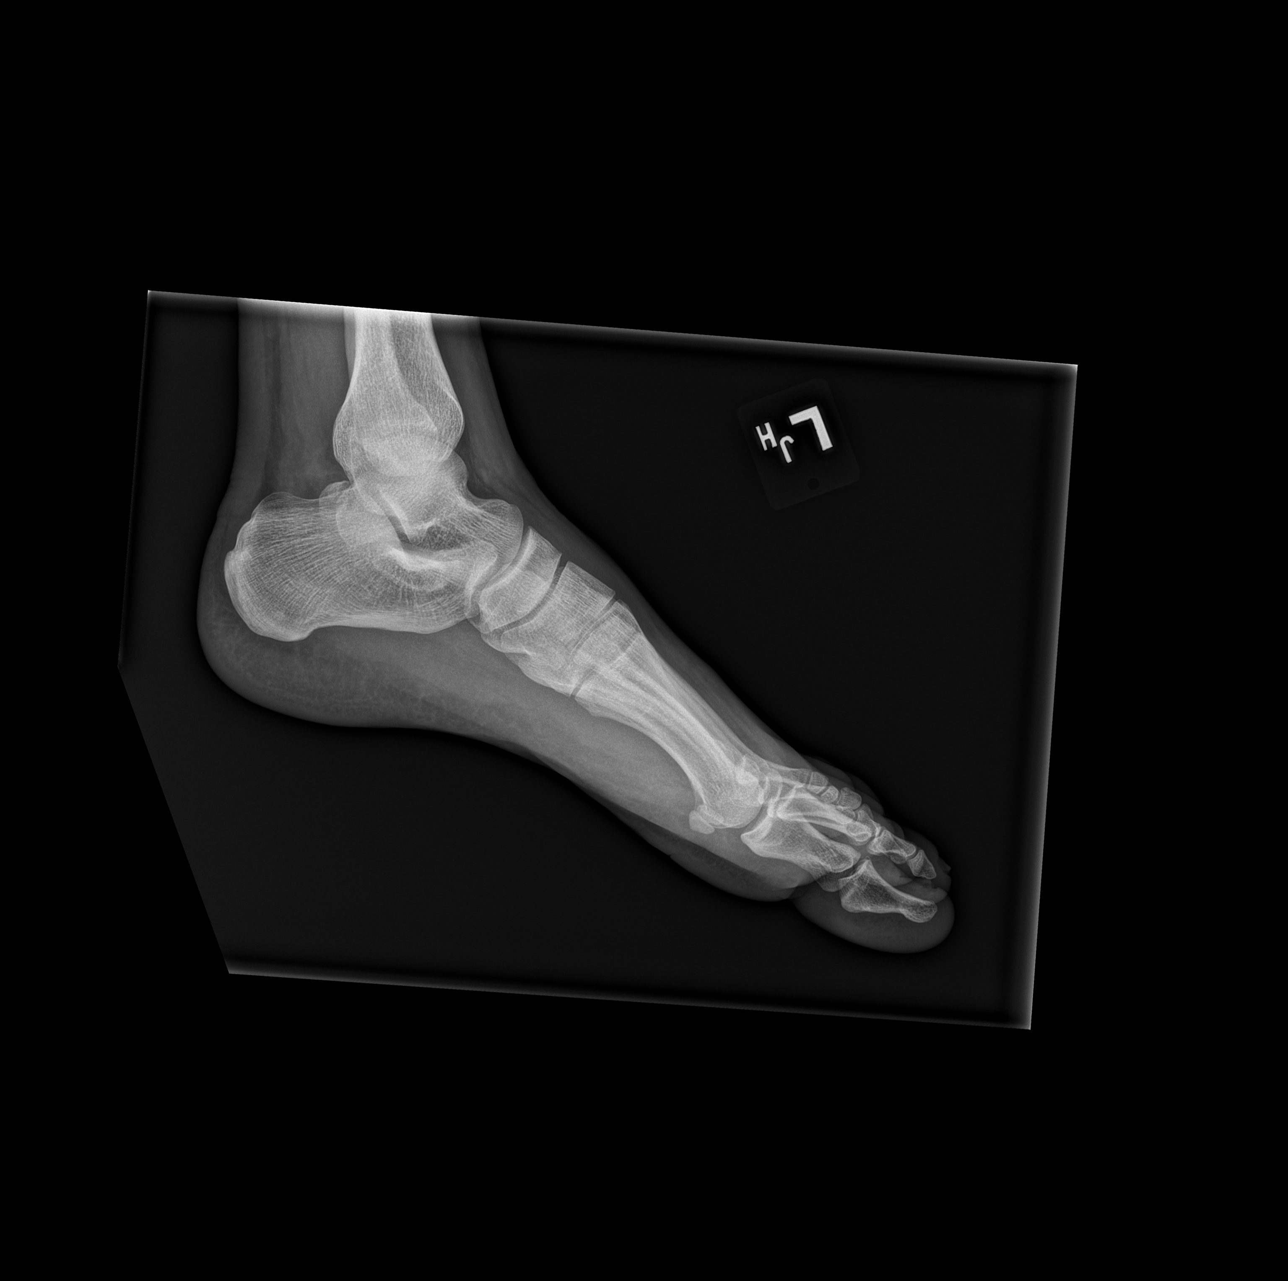

[3 of 3 positions shown; findings below may reference images not displayed]

FINDINGS: Osseous mineralization normal.

Joint spaces preserved.

Thin linear lucency identified at the lateral aspect of the proximal
shaft of the fifth metatarsal consistent with a stress fracture.

No additional fracture, dislocation, or bone destruction.

No soft tissue gas or evidence of osteomyelitis identified.
IMPRESSION: No radiographic evidence of osteomyelitis or soft tissue gas.

Linear cortical lucency lateral aspect, proximal fifth metatarsal
consistent with stress fracture.

These results will be called to the ordering clinician or
representative by the Radiologist Assistant, and communication
documented in the PACS or zVision Dashboard.

## 2020-01-29 ENCOUNTER — Other Ambulatory Visit: Payer: Self-pay

## 2020-01-29 ENCOUNTER — Emergency Department (HOSPITAL_COMMUNITY)
Admission: EM | Admit: 2020-01-29 | Discharge: 2020-01-30 | Disposition: A | Discharge status: Home / Self Care | Payer: Self-pay | Attending: Emergency Medicine | Admitting: Emergency Medicine

## 2020-01-29 DIAGNOSIS — M79601 Pain in right arm: Secondary | ICD-10-CM | POA: Insufficient documentation

## 2020-01-29 DIAGNOSIS — I10 Essential (primary) hypertension: Secondary | ICD-10-CM | POA: Insufficient documentation

## 2020-01-29 DIAGNOSIS — M79602 Pain in left arm: Secondary | ICD-10-CM | POA: Insufficient documentation

## 2020-01-29 DIAGNOSIS — Z5321 Procedure and treatment not carried out due to patient leaving prior to being seen by health care provider: Secondary | ICD-10-CM | POA: Insufficient documentation

## 2020-01-29 HISTORY — DX: Essential (primary) hypertension: I10

## 2020-01-30 ENCOUNTER — Encounter (HOSPITAL_COMMUNITY): Payer: Self-pay | Admitting: Emergency Medicine

## 2020-01-30 ENCOUNTER — Telehealth: Payer: Self-pay | Admitting: Family Medicine

## 2020-01-30 ENCOUNTER — Ambulatory Visit (INDEPENDENT_AMBULATORY_CARE_PROVIDER_SITE_OTHER): Payer: Self-pay | Admitting: Family Medicine

## 2020-01-30 ENCOUNTER — Other Ambulatory Visit: Payer: Self-pay

## 2020-01-30 ENCOUNTER — Encounter: Payer: Self-pay | Admitting: Family Medicine

## 2020-01-30 VITALS — BP 138/98 | HR 78 | Wt 216.6 lb

## 2020-01-30 DIAGNOSIS — F411 Generalized anxiety disorder: Secondary | ICD-10-CM

## 2020-01-30 DIAGNOSIS — F1010 Alcohol abuse, uncomplicated: Secondary | ICD-10-CM

## 2020-01-30 DIAGNOSIS — K219 Gastro-esophageal reflux disease without esophagitis: Secondary | ICD-10-CM

## 2020-01-30 DIAGNOSIS — I10 Essential (primary) hypertension: Secondary | ICD-10-CM

## 2020-01-30 LAB — COMPREHENSIVE METABOLIC PANEL
ALT: 29 U/L (ref 0–44)
AST: 25 U/L (ref 15–41)
Albumin: 4.2 g/dL (ref 3.5–5.0)
Alkaline Phosphatase: 42 U/L (ref 38–126)
Anion gap: 8 (ref 5–15)
BUN: 14 mg/dL (ref 6–20)
CO2: 28 mmol/L (ref 22–32)
Calcium: 9.9 mg/dL (ref 8.9–10.3)
Chloride: 103 mmol/L (ref 98–111)
Creatinine, Ser: 0.96 mg/dL (ref 0.61–1.24)
GFR calc Af Amer: >60 mL/min (ref >60)
GFR calc non Af Amer: >60 mL/min (ref >60)
Glucose, Bld: 102 mg/dL — ABNORMAL HIGH (ref 70–99)
Potassium: 3.8 mmol/L (ref 3.5–5.1)
Sodium: 139 mmol/L (ref 135–145)
Total Bilirubin: 0.9 mg/dL (ref 0.3–1.2)
Total Protein: 6.9 g/dL (ref 6.5–8.1)

## 2020-01-30 LAB — CBC WITH DIFFERENTIAL/PLATELET
Abs Immature Granulocytes: 0.01 10*3/uL (ref 0.00–0.07)
Basophils Absolute: 0 10*3/uL (ref 0.0–0.1)
Basophils Relative: 1 %
Eosinophils Absolute: 0.3 10*3/uL (ref 0.0–0.5)
Eosinophils Relative: 3 %
HCT: 44.9 % (ref 39.0–52.0)
Hemoglobin: 15.3 g/dL (ref 13.0–17.0)
Immature Granulocytes: 0 %
Lymphocytes Relative: 34 %
Lymphs Abs: 2.5 10*3/uL (ref 0.7–4.0)
MCH: 29.8 pg (ref 26.0–34.0)
MCHC: 34.1 g/dL (ref 30.0–36.0)
MCV: 87.5 fL (ref 80.0–100.0)
Monocytes Absolute: 0.7 10*3/uL (ref 0.1–1.0)
Monocytes Relative: 10 %
Neutro Abs: 3.9 10*3/uL (ref 1.7–7.7)
Neutrophils Relative %: 52 %
Platelets: 159 10*3/uL (ref 150–400)
RBC: 5.13 MIL/uL (ref 4.22–5.81)
RDW: 12.3 % (ref 11.5–15.5)
WBC: 7.4 10*3/uL (ref 4.0–10.5)
nRBC: 0 % (ref 0.0–0.2)

## 2020-01-30 LAB — POCT URINALYSIS DIP (PROADVANTAGE DEVICE)
Bilirubin, UA: NEGATIVE
Blood, UA: NEGATIVE
Glucose, UA: NEGATIVE mg/dL
Ketones, POC UA: NEGATIVE mg/dL
Leukocytes, UA: NEGATIVE
Nitrite, UA: NEGATIVE
Protein Ur, POC: NEGATIVE mg/dL
Specific Gravity, Urine: 1.03
Urobilinogen, Ur: NEGATIVE
pH, UA: 5.5 (ref 5.0–8.0)

## 2020-01-30 LAB — TROPONIN I (HIGH SENSITIVITY): Troponin I (High Sensitivity): 4 ng/L (ref <18)

## 2020-01-30 MED ORDER — LOSARTAN POTASSIUM-HCTZ 50-12.5 MG PO TABS: 1.0000 | ORAL_TABLET | Freq: Every day | ORAL | 1 refills | Status: DC

## 2020-01-30 NOTE — Telephone Encounter (Signed)
Called left pt message

## 2020-01-30 NOTE — Telephone Encounter (Signed)
Pt states his BP is 160/110 now and he has a massage today at 12:30 & wanted to know if you thought it was ok for him to go with elevated BP ?  Please advise ASAP Thanks

## 2020-01-30 NOTE — ED Triage Notes (Signed)
Pt to ED c/o his blood pressure being elevated  With bil arm pain.  Pt st's he is prescribed Metoprolol but has not been taking it.  Pt st's he did take one tonight

## 2020-01-30 NOTE — Progress Notes (Signed)
   Subjective:    Patient ID: Juan Hodge, male    DOB: [birth year], 44 y.o.   MRN: [id number]  HPI Chief Complaint  Patient presents with  . follow-up    follow-up on bp. elevated 170/120. today was 141/100. took bp first time last night in over a year   Is here with concerns regarding elevated blood pressures.  States he has not been taking his blood pressure medication, metoprolol at all for the past year at least.  He typically does not check his blood pressure at home but yesterday he was not feeling well.  Reports having increased stress at home.  States his blood pressure was significantly elevated and he was having what felt like indigestion and left arm pain so he went to the emergency department last night. States his BP last night in the ED was 158/110 States this morning his BP was 160/120.   States he has been eating [place] food and carb heavy food. High sodium foods.  He had [place] food and then pizza yesterday evening.  States he overate.  States he had also had a lot of alcohol the previous night and felt hung over yesterday.  He has a history of anxiety so he took alprazolam.  States it did help.  Denies having any symptoms currently but states he is ready to start taking better care of himself and is willing to start on blood pressure medication.  Denies fever, chills, dizziness, headache, chest pain, palpitations, shortness of breath, abdominal pain, N/V/D, urinary symptoms, LE edema.    Father died of an MI in his late 25s.     Review of Systems Pertinent positives and negatives in the history of present illness.     Objective:   Physical Exam BP (!) 138/98   Pulse 78   Wt 216 lb 9.6 oz (98.2 kg)   BMI 31.08 kg/m   Alert and in no distress.  Cardiac exam shows a regular sinus rhythm without murmurs or gallops. Lungs are clear to auscultation.  Abdomen soft, nondistended, nontender, normal bowel sounds.  Extremities without edema.  Skin is warm and dry.   DTRs symmetric and normal, no clonus.       Assessment & Plan:  Essential hypertension, benign - Plan: losartan-hydrochlorothiazide (HYZAAR) 50-12.5 MG tablet, POCT Urinalysis DIP (Proadvantage Device) -Urinalysis dipstick negative.  EKG done in the ED last night labs were reviewed with patient, these were done in the ED last night. No sign of target and organ damage. In-depth counseling on controlling hypertension including limiting salt in the diet as well as cutting back significantly on alcohol use. He has not been taking metoprolol.  He will discontinue this completely. Prescription sent for losartan/HCTZ. Discussed keeping a close eye on his blood pressure and bring in his machine and readings to his follow-up visit in 4 weeks  Gastroesophageal reflux disease, unspecified whether esophagitis present -Avoid overeating and foods that trigger reflux  Generalized anxiety disorder -He occasionally takes alprazolam.

## 2020-01-30 NOTE — Telephone Encounter (Signed)
That should be fine. It may actually help lower his BP.

## 2020-01-30 NOTE — Patient Instructions (Addendum)
Start on the new medication once daily and keep an eye on your blood pressure at home.      DASH Eating Plan DASH stands for "Dietary Approaches to Stop Hypertension." The DASH eating plan is a healthy eating plan that has been shown to reduce high blood pressure (hypertension). It may also reduce your risk for type 2 diabetes, heart disease, and stroke. The DASH eating plan may also help with weight loss. What are tips for following this plan?  General guidelines  Avoid eating more than 2,300 mg (milligrams) of salt (sodium) a day. If you have hypertension, you may need to reduce your sodium intake to 1,500 mg a day.  Limit alcohol intake to no more than 1 drink a day for nonpregnant women and 2 drinks a day for men. One drink equals 12 oz of beer, 5 oz of wine, or 1 oz of hard liquor.  Work with your health care provider to maintain a healthy body weight or to lose weight. Ask what an ideal weight is for you.  Get at least 30 minutes of exercise that causes your heart to beat faster (aerobic exercise) most days of the week. Activities may include walking, swimming, or biking.  Work with your health care provider or diet and nutrition specialist (dietitian) to adjust your eating plan to your individual calorie needs. Reading food labels   Check food labels for the amount of sodium per serving. Choose foods with less than 5 percent of the Daily Value of sodium. Generally, foods with less than 300 mg of sodium per serving fit into this eating plan.  To find whole grains, look for the word "whole" as the first word in the ingredient list. Shopping  Buy products labeled as "low-sodium" or "no salt added."  Buy fresh foods. Avoid canned foods and premade or frozen meals. Cooking  Avoid adding salt when cooking. Use salt-free seasonings or herbs instead of table salt or sea salt. Check with your health care provider or pharmacist before using salt substitutes.  Do not fry foods. Cook  foods using healthy methods such as baking, boiling, grilling, and broiling instead.  Cook with heart-healthy oils, such as olive, canola, soybean, or sunflower oil. Meal planning  Eat a balanced diet that includes: ? 5 or more servings of fruits and vegetables each day. At each meal, try to fill half of your plate with fruits and vegetables. ? Up to 6-8 servings of whole grains each day. ? Less than 6 oz of lean meat, poultry, or fish each day. A 3-oz serving of meat is about the same size as a deck of cards. One egg equals 1 oz. ? 2 servings of low-fat dairy each day. ? A serving of nuts, seeds, or beans 5 times each week. ? Heart-healthy fats. Healthy fats called Omega-3 fatty acids are found in foods such as flaxseeds and coldwater fish, like sardines, salmon, and mackerel.  Limit how much you eat of the following: ? Canned or prepackaged foods. ? Food that is high in trans fat, such as fried foods. ? Food that is high in saturated fat, such as fatty meat. ? Sweets, desserts, sugary drinks, and other foods with added sugar. ? Full-fat dairy products.  Do not salt foods before eating.  Try to eat at least 2 vegetarian meals each week.  Eat more home-cooked food and less restaurant, buffet, and fast food.  When eating at a restaurant, ask that your food be prepared with less salt or  no salt, if possible. What foods are recommended? The items listed may not be a complete list. Talk with your dietitian about what dietary choices are best for you. Grains Whole-grain or whole-wheat bread. Whole-grain or whole-wheat pasta. Binette rice. [place]row. Bulgur. Whole-grain and low-sodium cereals. Pita bread. Low-fat, low-sodium crackers. Whole-wheat flour tortillas. Vegetables Fresh or frozen vegetables (raw, steamed, roasted, or grilled). Low-sodium or reduced-sodium tomato and vegetable juice. Low-sodium or reduced-sodium tomato sauce and tomato paste. Low-sodium or reduced-sodium canned  vegetables. Fruits All fresh, dried, or frozen fruit. Canned fruit in natural juice (without added sugar). Meat and other protein foods Skinless chicken or [place]. Ground chicken or [place]. Pork with fat trimmed off. Fish and seafood. Egg whites. Dried beans, peas, or lentils. Unsalted nuts, nut butters, and seeds. Unsalted canned beans. Lean cuts of beef with fat trimmed off. Low-sodium, lean deli meat. Dairy Low-fat (1%) or fat-free (skim) milk. Fat-free, low-fat, or reduced-fat cheeses. Nonfat, low-sodium ricotta or cottage cheese. Low-fat or nonfat yogurt. Low-fat, low-sodium cheese. Fats and oils Soft margarine without trans fats. Vegetable oil. Low-fat, reduced-fat, or light mayonnaise and salad dressings (reduced-sodium). Canola, safflower, olive, soybean, and sunflower oils. Avocado. Seasoning and other foods Herbs. Spices. Seasoning mixes without salt. Unsalted popcorn and pretzels. Fat-free sweets. What foods are not recommended? The items listed may not be a complete list. Talk with your dietitian about what dietary choices are best for you. Grains Baked goods made with fat, such as croissants, muffins, or some breads. Dry pasta or rice meal packs. Vegetables Creamed or fried vegetables. Vegetables in a cheese sauce. Regular canned vegetables (not low-sodium or reduced-sodium). Regular canned tomato sauce and paste (not low-sodium or reduced-sodium). Regular tomato and vegetable juice (not low-sodium or reduced-sodium). Angie Fava. Olives. Fruits Canned fruit in a light or heavy syrup. Fried fruit. Fruit in cream or butter sauce. Meat and other protein foods Fatty cuts of meat. Ribs. Fried meat. Berniece Salines. Sausage. Bologna and other processed lunch meats. Salami. Fatback. Hotdogs. Bratwurst. Salted nuts and seeds. Canned beans with added salt. Canned or smoked fish. Whole eggs or egg yolks. Chicken or [place] with skin. Dairy Whole or 2% milk, cream, and half-and-half. Whole or full-fat  cream cheese. Whole-fat or sweetened yogurt. Full-fat cheese. Nondairy creamers. Whipped toppings. Processed cheese and cheese spreads. Fats and oils Butter. Stick margarine. Lard. Shortening. Ghee. Bacon fat. Tropical oils, such as coconut, palm kernel, or palm oil. Seasoning and other foods Salted popcorn and pretzels. Onion salt, garlic salt, seasoned salt, table salt, and sea salt. Worcestershire sauce. Tartar sauce. Barbecue sauce. Teriyaki sauce. Soy sauce, including reduced-sodium. Steak sauce. Canned and packaged gravies. Fish sauce. Oyster sauce. Cocktail sauce. Horseradish that you find on the shelf. Ketchup. Mustard. Meat flavorings and tenderizers. Bouillon cubes. Hot sauce and Tabasco sauce. Premade or packaged marinades. Premade or packaged taco seasonings. Relishes. Regular salad dressings. Where to find more information:  National Heart, Lung, and [place]: https://wilson-eaton.com/  American Heart Association: www.heart.org Summary  The DASH eating plan is a healthy eating plan that has been shown to reduce high blood pressure (hypertension). It may also reduce your risk for type 2 diabetes, heart disease, and stroke.  With the DASH eating plan, you should limit salt (sodium) intake to 2,300 mg a day. If you have hypertension, you may need to reduce your sodium intake to 1,500 mg a day.  When on the DASH eating plan, aim to eat more fresh fruits and vegetables, whole grains, lean proteins, low-fat dairy, and  heart-healthy fats.  Work with your health care provider or diet and nutrition specialist (dietitian) to adjust your eating plan to your individual calorie needs. This information is not intended to replace advice given to you by your health care provider. Make sure you discuss any questions you have with your health care provider. Document Revised: 05/06/2017 Document Reviewed: 05/17/2016 Elsevier Patient Education  2020 [employer].

## 2020-02-12 ENCOUNTER — Other Ambulatory Visit: Payer: Self-pay

## 2020-02-12 ENCOUNTER — Encounter: Payer: Self-pay | Admitting: Family Medicine

## 2020-02-12 ENCOUNTER — Ambulatory Visit (INDEPENDENT_AMBULATORY_CARE_PROVIDER_SITE_OTHER): Payer: Self-pay | Admitting: Family Medicine

## 2020-02-12 VITALS — BP 130/98 | HR 92 | Temp 98.4°F | Wt 211.2 lb

## 2020-02-12 DIAGNOSIS — Z91199 Patient's noncompliance with other medical treatment and regimen due to unspecified reason: Secondary | ICD-10-CM

## 2020-02-12 DIAGNOSIS — Z9119 Patient's noncompliance with other medical treatment and regimen: Secondary | ICD-10-CM

## 2020-02-12 DIAGNOSIS — F411 Generalized anxiety disorder: Secondary | ICD-10-CM

## 2020-02-12 DIAGNOSIS — M79622 Pain in left upper arm: Secondary | ICD-10-CM

## 2020-02-12 DIAGNOSIS — H539 Unspecified visual disturbance: Secondary | ICD-10-CM

## 2020-02-12 DIAGNOSIS — M79621 Pain in right upper arm: Secondary | ICD-10-CM

## 2020-02-12 DIAGNOSIS — Z79899 Other long term (current) drug therapy: Secondary | ICD-10-CM

## 2020-02-12 DIAGNOSIS — I10 Essential (primary) hypertension: Secondary | ICD-10-CM

## 2020-02-12 DIAGNOSIS — G4700 Insomnia, unspecified: Secondary | ICD-10-CM

## 2020-02-12 NOTE — Progress Notes (Signed)
Subjective:    Patient ID: Juan Hodge, male    DOB: 25-Mar-1976, 44 y.o.   MRN: [id number]  HPI Chief Complaint  Patient presents with  . other    pain in arm and blurry vision being going on for 1 month change in vision in rt. eye lasted for 10-15 minutes   He is here with multiple concerns. He was last here on 01/30/2020 with concerns of HTN and anxiety.  States he recently stopped taking his losartan- HCTZ because he thought is may be causing side effects including vision changes and bilateral upper arm pain. He has not taken his medication in 3-4 days. His BP has increased. He has his BP machine with him today.   States he exercised with a trainer today and did fine, no pain or DOE.   States he has intermittent pain in both upper arms. Pain is dull and moves from one arm to the next at times. Pain is relieved when he raises his arms in the air and also with Xanax.   States he has been able to get Xanax from his mother. I have not prescribed this for him in over a year.   Reports taking Xanax helps his anxiety. States he likes not feeling anxious.  States he often has issues sleeping and Xanax helps him sleep.  Occasionally drinks alcohol to help with insomnia. States he has cut back on alcohol since our last visit.    States he sees a Warden/ranger. States he saw a psychiatrist one time but it did not go well.  He has been on psychotropic medications in the past. He prefers alprazolam for anxiety.    Reports having 2 episodes of vision changes but he is not sure if it was both eyes or only his right eye. States he was not sure if he looked into the sun. States he saw lights, different colors "swirling".  The first time is lasted only a few minutes. The second time is lasted15 minutes and resolved spontaneously.   He is concerned about a stroke. No focal weakness, headache, numbness or tingling.   Denies fever, chills, dizziness, chest pain,  shortness of breath, abdominal pain,  N/V/D, urinary symptoms, LE edema.   Reviewed allergies, medications, past medical, surgical, family, and social history.   Review of Systems Pertinent positives and negatives in the history of present illness.     Objective:   Physical Exam Constitutional:      General: He is not in acute distress.    Appearance: Normal appearance. He is not ill-appearing.  Eyes:     General: Lids are normal. Vision grossly intact. No visual field deficit.    Extraocular Movements: Extraocular movements intact.     Conjunctiva/sclera: Conjunctivae normal.     Pupils: Pupils are equal, round, and reactive to light.  Neck:     Thyroid: No thyromegaly.     Vascular: No JVD.  Cardiovascular:     Rate and Rhythm: Normal rate and regular rhythm.     Pulses: Normal pulses.     Heart sounds: Normal heart sounds.  Pulmonary:     Effort: Pulmonary effort is normal.     Breath sounds: Normal breath sounds.  Musculoskeletal:     Right shoulder: Normal.     Left shoulder: Normal.     Right upper arm: Normal.     Left upper arm: Normal.     Right hand: Normal.     Left hand: Normal.  Cervical back: Normal range of motion and neck supple.     Right lower leg: No edema.     Left lower leg: No edema.  Lymphadenopathy:     Cervical: No cervical adenopathy.     Right cervical: No superficial cervical adenopathy.    Left cervical: No superficial cervical adenopathy.  Skin:    General: Skin is warm and dry.     Capillary Refill: Capillary refill takes less than 2 seconds.     Coloration: Skin is not pale.     Findings: No rash.  Neurological:     General: No focal deficit present.     Mental Status: He is alert and oriented to person, place, and time.     Cranial Nerves: No cranial nerve deficit or facial asymmetry.     Sensory: Sensation is intact.     Motor: Motor function is intact. No weakness, tremor or pronator drift.     Coordination: Coordination is intact. Finger-Nose-Finger Test normal.      Gait: Gait is intact.     Deep Tendon Reflexes:     Reflex Scores:      Bicep reflexes are 2+ on the right side and 2+ on the left side.      Brachioradialis reflexes are 2+ on the right side and 2+ on the left side.      Patellar reflexes are 3+ on the right side and 3+ on the left side.      Achilles reflexes are 1+ on the right side and 1+ on the left side. Psychiatric:        Attention and Perception: Attention normal.        Speech: Speech normal.        Behavior: Behavior normal.        Thought Content: Thought content normal.        Cognition and Memory: Cognition and memory normal.     Comments: Slightly anxious    BP (!) 130/98 (BP Location: Right Arm, Cuff Size: Normal)   Pulse 92   Temp 98.4 F (36.9 C)   Wt 211 lb 3.2 oz (95.8 kg)   BMI 30.30 kg/m       Assessment & Plan:  Generalized anxiety disorder -advised against dependence on alprazolam. Recommend he call and schedule with a psychiatrist with the new [facility] which is close to our office. He is agreeable. Congratulated him on cutting back on alcohol.   Essential hypertension, benign -recommend starting back on HTN medication and reassured that his symptoms are unlikely related to losartan-HCTZ. Discussed the importance of compliance with medication. Recommend low sodium diet and limiting alcohol. He will keep an eye on his BP at home and follow up.   Transient vision disturbance of right eye -he is unsure if his vision was impaired in his right eye only or both eyes. Discussed getting a brain MRI but he currently does not have health insurance and would like to hold off until he does have it, which will be in the next day or so. Normal neuro exam today. He should be seen immediately if he has another episode and this may have to be in an urgent care of ED if our office is closed.   Insomnia, unspecified type -recommend avoiding self medicating with Xanax and alcohol. Recommend follow up with a psychiatrist. I  provided him with the number to the new [facility] and he will call to schedule.   Pain in right upper arm -ongoing  for apprx one month. unclear etiology but it is unlikely this is anything serious since it comes and goes and resolves with alprazolam.   Pain in left upper arm ongoing for approx one month. unclear etiology but it is unlikely this is anything serious since it comes and goes and resolves with alprazolam.  Noncompliance -he appears somewhat motivated to address his anxiety. Strongly encouraged good medication compliance with HTN medication.   Long-term current use of benzodiazepine -recommend cutting back on alprazolam and not taking other people's prescription, even a family member. He is agreeable to schedule with a psychiatrist.

## 2020-02-12 NOTE — Patient Instructions (Addendum)
Start back on your blood pressure medication.   If you have any changes in vision, go to the urgent care or emergency department.     Please call [facility].  [phone]

## 2020-02-18 ENCOUNTER — Telehealth: Payer: Self-pay

## 2020-02-18 DIAGNOSIS — Z8249 Family history of ischemic heart disease and other diseases of the circulatory system: Secondary | ICD-10-CM

## 2020-02-18 DIAGNOSIS — I10 Essential (primary) hypertension: Secondary | ICD-10-CM

## 2020-02-18 NOTE — Telephone Encounter (Signed)
Pt. Called stating that he has been seen a few times recently for BP issues and history of heart issues in his family he wanted to know if he could get a referral to a cardiologists to f/u up with them for these issues.

## 2020-02-18 NOTE — Telephone Encounter (Signed)
Ok to refer him to cardiology per his request for HTN and family history of heart disease. Please let him know.

## 2020-02-19 NOTE — Addendum Note (Signed)
Addended by: Herminio Commons A on: 02/19/2020 08:56 AM   Modules accepted: Orders

## 2020-02-19 NOTE — Telephone Encounter (Signed)
Left detailed message that patient was being referred to cardiology

## 2020-02-27 ENCOUNTER — Other Ambulatory Visit: Payer: Self-pay

## 2020-02-27 ENCOUNTER — Ambulatory Visit (INDEPENDENT_AMBULATORY_CARE_PROVIDER_SITE_OTHER): Payer: Self-pay | Admitting: Family Medicine

## 2020-02-27 ENCOUNTER — Encounter: Payer: Self-pay | Admitting: Family Medicine

## 2020-02-27 VITALS — BP 120/80 | HR 78 | Wt 211.2 lb

## 2020-02-27 DIAGNOSIS — I10 Essential (primary) hypertension: Secondary | ICD-10-CM

## 2020-02-27 DIAGNOSIS — F411 Generalized anxiety disorder: Secondary | ICD-10-CM

## 2020-02-27 NOTE — Progress Notes (Signed)
° °  Subjective:    Patient ID: Juan Hodge, male    DOB: 1975-07-07, 44 y.o.   MRN: [id number]  HPI Chief Complaint  Patient presents with   aniexty    aniexty - seeing psych tomorrow, blood pressure follow-up. ranging 130/80 sometimes higher, bottom number usually higher   He is here to follow up on HTN. He had stopped his medication prior to his last visit. Since then he reports good compliance with Hyzaar 50/12.5 mg. States BP at home has improved.   He requested a referral to cardiology due to family history of heart disease and due to his concern regarding his health.   States he has an appt tomorrow with a psychiatrist for anxiety and insomnia.   No new concerns.    Review of Systems Pertinent positives and negatives in the history of present illness.     Objective:   Physical Exam BP 120/80    Pulse 78    Wt 211 lb 3.2 oz (95.8 kg)    BMI 30.30 kg/m   Alert and oriented and in no acute distress.  Not otherwise examined.      Assessment & Plan:  Essential hypertension, benign  Generalized anxiety disorder  His blood pressure has improved significantly since being on his medication.  Recommend he continue eating a low-sodium and healthy diet and avoiding alcohol.  Offered to check lipids but he is not fasting.   States he would feel more comfortable seeing cardiology.  Currently asymptomatic. Encouraged him to see his psychiatrist tomorrow as scheduled.  Recommend he discuss his use of Xanax with her.

## 2020-02-29 ENCOUNTER — Ambulatory Visit: Payer: Self-pay | Admitting: Internal Medicine

## 2020-03-19 ENCOUNTER — Encounter: Payer: Self-pay | Admitting: Family Medicine

## 2020-06-29 ENCOUNTER — Other Ambulatory Visit: Payer: Self-pay | Admitting: Family Medicine

## 2020-06-29 DIAGNOSIS — I10 Essential (primary) hypertension: Secondary | ICD-10-CM

## 2020-07-01 ENCOUNTER — Telehealth: Payer: Self-pay | Admitting: Family Medicine

## 2020-07-01 NOTE — Telephone Encounter (Signed)
Let him know that the CDC recommendations are now 5 days after your symptoms start, if you are essentially back to normal or almost back to normal you are free to do what everyone.  Follow-up testing is not recommended as that test can stay positive for quite long time.

## 2020-07-01 NOTE — Telephone Encounter (Signed)
Pt stated he still has symptoms and was advised he has to do the full ten days.

## 2020-07-01 NOTE — Telephone Encounter (Signed)
Please call  Pt tested positive on Friday, home test  He has Slight ha, cong, maybe some fatigue  He has questions about how long before he can be around others, He has been told 5 days but he is questioning that, he took another home test yesterday and it was still positive

## 2020-09-24 ENCOUNTER — Telehealth (INDEPENDENT_AMBULATORY_CARE_PROVIDER_SITE_OTHER): Payer: Self-pay | Admitting: Family Medicine

## 2020-09-24 ENCOUNTER — Other Ambulatory Visit: Payer: Self-pay

## 2020-09-24 ENCOUNTER — Encounter: Payer: Self-pay | Admitting: Family Medicine

## 2020-09-24 VITALS — BP 159/104 | HR 103 | Temp 99.0°F | Ht 70.0 in | Wt 205.0 lb

## 2020-09-24 DIAGNOSIS — R6889 Other general symptoms and signs: Secondary | ICD-10-CM

## 2020-09-24 NOTE — Patient Instructions (Signed)
Drink plenty of water. Take Mucinex DM 12 hour, twice daily. STOP the Dayquil--it is raising your blood pressure, and there are other medications I'd prefer for you to take. Restart your blood pressure medication.  I recommend Neti-pot or sinus rinse kit once or twice daily.  Avoid decongestants, only okay to take if your blood pressure is under 130/80. Then you can use it, but monitor your blood pressure and stop it if over 140/90. Use plain sudafed, NOT the Dayquil, since that also has dextromethorphan which you will be getting from the Mucinex DM.  If symptoms persist or worsen, please send [place] a detailed message with the changes (fever, color of mucus, cough, etc).

## 2020-09-24 NOTE — Progress Notes (Signed)
Start time: 12:25 End time: 12:48  Virtual Visit via Video Note  I connected with Juan Hodge on 09/24/20 by a video enabled telemedicine application and verified that I am speaking with the correct person using two identifiers.  Location: Patient: home Provider: office   I discussed the limitations of evaluation and management by telemedicine and the availability of in person appointments. The patient expressed understanding and agreed to proceed.  History of Present Illness:  Chief Complaint  Patient presents with  . Nasal Congestion    VIRTUAL nasal congestion and severe HA. Coughing up a lot discolored mucus. Symptoms started Monday. Low grade temp and body aches. Feels like this is going into his ears.     3 days ago he thought it was allergies--runny nose, congestion, itchy eyes. 2 days ago he felt more "flu-ish"--headache, cough got worse. Had a negative home test for COVID yesterday. Phlegm is yellow-gray throughout the day. He is prone to sinus infections. He noticed some discomfort and popping in his ears.  He was taking zyrtec until he switched to dayquil. He uses Zyrtec as needed year-round.  +sick contacts--girlfriend, daughter (84 yo), and mother.  He is the only one with discolored mucus. They are already all feeling better.  He thinks he might be feeling a little better today.  He did not get a flu shot this year  PMH, PSH, [place] reviewed  Outpatient Encounter Medications as of 09/24/2020  Medication Sig Note  . Pseudoephedrine-APAP-DM (DAYQUIL PO) Take 2 capsules by mouth as needed. 09/24/2020: Took this am  . losartan-hydrochlorothiazide (HYZAAR) 50-12.5 MG tablet TAKE 1 TABLET BY MOUTH EVERY DAY (Patient not taking: Reported on 09/24/2020) 09/24/2020: Hasn't taken in a few days   No facility-administered encounter medications on file as of 09/24/2020.   Allergies  Allergen Reactions  . Erythromycin Other (See Comments)    Tears stomach up   ROS: URI  symptoms and headache per HPI. No chest pain. No GI complaints. No shortness of breath.  See HPI   Observations/Objective:  BP (!) 159/104   Pulse (!) 103   Temp 99 F (37.2 C) (Temporal)   Ht $R'5\' 10"'qQ$  (1.778 m)   Wt 205 lb (93 kg)   BMI 29.41 kg/m   Mildly ill-appearing male, congested, coughing during visit. He is alert, oriented. Cranial nerves grossly intact. He is speaking easily, in no distress Exam is limited due to virtual nature of the visit   Assessment and Plan:  Flu-like symptoms - pt declines testing (no insurance, wouldn't treat). Supportive measures reviewed, s/sx bacterial infections discussed  Declines testing, no insurance, wouldn't do Tamiflu.    Drink plenty of water. Take Mucinex DM 12 hour, twice daily. STOP the Dayquil--it is raising your blood pressure, and there are other medications I'd prefer for you to take. Restart your blood pressure medication.  I recommend Neti-pot or sinus rinse kit once or twice daily.  Avoid decongestants, only okay to take if your blood pressure is under 130/80. Then you can use it, but monitor your blood pressure and stop it if over 140/90. Use plain sudafed, NOT the Dayquil, since that also has dextromethorphan which you will be getting from the Mucinex DM.  If symptoms persist or worsen, please send [place] a detailed message with the changes (fever, color of mucus, cough, etc).    Follow Up Instructions:    I discussed the assessment and treatment plan with the patient. The patient was provided an opportunity to ask questions  and all were answered. The patient agreed with the plan and demonstrated an understanding of the instructions.   The patient was advised to call back or seek an in-person evaluation if the symptoms worsen or if the condition fails to improve as anticipated.  I spent 25 minutes dedicated to the care of this patient, including pre-visit review of records, face to face time, post-visit ordering of  testing and documentation.    Vikki Ports, MD

## 2020-09-25 ENCOUNTER — Telehealth: Payer: Self-pay | Admitting: Family Medicine

## 2020-09-25 NOTE — Telephone Encounter (Signed)
Pt had a virtual with you yesterday and is still pretty sick. He wants to know if he can come by sometime today to be tested for flu and whatever else you recommended.

## 2020-09-25 NOTE — Telephone Encounter (Signed)
Flu and COVID tests please. If flu, he'll at least know why, even though no treatment indicated at this point (too late)

## 2020-09-25 NOTE — Telephone Encounter (Signed)
Mailbox is full and cannot except any messages

## 2020-09-29 NOTE — Telephone Encounter (Signed)
Called again twice today to check on patient-mailbix is still full. Will send MyChart message.

## 2021-02-04 ENCOUNTER — Other Ambulatory Visit: Payer: Self-pay | Admitting: Family Medicine

## 2021-02-04 DIAGNOSIS — I10 Essential (primary) hypertension: Secondary | ICD-10-CM

## 2021-02-04 NOTE — Telephone Encounter (Signed)
Request for a refill on the pts. Losartan/hctz last apt was 09/24/20.

## 2021-03-16 ENCOUNTER — Encounter: Payer: Self-pay | Admitting: Family Medicine

## 2021-03-16 DIAGNOSIS — Z0000 Encounter for general adult medical examination without abnormal findings: Secondary | ICD-10-CM

## 2021-04-08 ENCOUNTER — Encounter: Payer: Self-pay | Admitting: Family Medicine

## 2021-05-19 ENCOUNTER — Other Ambulatory Visit: Payer: Self-pay

## 2021-05-19 DIAGNOSIS — I10 Essential (primary) hypertension: Secondary | ICD-10-CM

## 2021-05-19 MED ORDER — LOSARTAN POTASSIUM-HCTZ 50-12.5 MG PO TABS: 1.0000 | ORAL_TABLET | Freq: Every day | ORAL | 0 refills | Status: DC

## 2021-07-13 ENCOUNTER — Telehealth: Payer: Self-pay | Admitting: Family Medicine

## 2021-07-13 ENCOUNTER — Other Ambulatory Visit: Payer: Self-pay

## 2021-07-13 NOTE — Telephone Encounter (Signed)
Pt was called at 1:15 and 1:33 for his virtual visit by Selena Batten, they both went to voice mail.  Pt states he tried to call us at 1:23 and phones said we were at lunch.  Patient called again at 4:11 wanting to be seen. I explained we do not have any thing open this late.  I could offer him an 8:00 in the morning.  He said he has several sleepless nights and he is out of his inhaler.  I asked patient why he didn't answer when we called.  He said he wasn't told we were going to call before the 1:30 appt time and every minute of every day is booked for him.    I advised him he could try an urgent care tonight and if no luck to call us back in the morning.

## 2021-07-17 ENCOUNTER — Ambulatory Visit: Payer: Self-pay | Admitting: Physician Assistant

## 2021-07-17 NOTE — Progress Notes (Deleted)
Acute Office Visit  Subjective:    Patient ID: Juan Hodge, male    DOB: [birth year], 46 y.o.   MRN: [id number]  No chief complaint on file.   HPI Patient is in today for ***  Past Medical History:  Diagnosis Date   Alcohol abuse, daily use    Anxiety    Asthma    GERD (gastroesophageal reflux disease)    Hypertension    Panic attacks    Substance abuse (HCC)     Past Surgical History:  Procedure Laterality Date   tubes in ears     as a child    Family History  Problem Relation Age of Onset   Heart disease Father 30       died of cardiac arrest 30    Social History   Socioeconomic History   Marital status: Single    Spouse name: Not on file   Number of children: 0   Years of education: Not on file   Highest education level: Not on file  Occupational History   Occupation: sales  Tobacco Use   Smoking status: Never   Smokeless tobacco: Never  Substance and Sexual Activity   Alcohol use: Yes    Alcohol/week: 0.0 standard drinks    Comment: 2-4 glasses of wine. (1/2-1 bottle a night)   Drug use: Yes    Types: Benzodiazepines    Comment: former user of marijuana and cocaine   Sexual activity: Not on file  Other Topics Concern   Not on file  Social History Narrative   Not on file   Social Determinants of Health   Financial Resource Strain: Not on file  Food Insecurity: Not on file  Transportation Needs: Not on file  Physical Activity: Not on file  Stress: Not on file  Social Connections: Not on file  Intimate Partner Violence: Not on file    Outpatient Medications Prior to Visit  Medication Sig Dispense Refill   losartan-hydrochlorothiazide (HYZAAR) 50-12.5 MG tablet Take 1 tablet by mouth daily. 90 tablet 0   Pseudoephedrine-APAP-DM (DAYQUIL PO) Take 2 capsules by mouth as needed.     No facility-administered medications prior to visit.    Allergies  Allergen Reactions   Erythromycin Other (See Comments)    Tears stomach up     Review of Systems     Objective:    Physical Exam  There were no vitals taken for this visit. Wt Readings from Last 3 Encounters:  09/24/20 205 lb (93 kg)  02/27/20 211 lb 3.2 oz (95.8 kg)  02/12/20 211 lb 3.2 oz (95.8 kg)    Health Maintenance Due  Topic Date Due   Hepatitis C Screening  Never done   TETANUS/TDAP  Never done   COVID-19 Vaccine (2 - Pfizer risk series) 10/18/2019   INFLUENZA VACCINE  Never done   COLONOSCOPY (Pts 45-106yrs Insurance coverage will need to be confirmed)  Never done    There are no preventive care reminders to display for this patient.   No results found for: TSH Lab Results  Component Value Date   WBC 7.4 01/30/2020   HGB 15.3 01/30/2020   HCT 44.9 01/30/2020   MCV 87.5 01/30/2020   PLT 159 01/30/2020   Lab Results  Component Value Date   NA 139 01/30/2020   K 3.8 01/30/2020   CO2 28 01/30/2020   GLUCOSE 102 (H) 01/30/2020   BUN 14 01/30/2020   CREATININE 0.96 01/30/2020   BILITOT 0.9 01/30/2020  ALKPHOS 42 01/30/2020   AST 25 01/30/2020   ALT 29 01/30/2020   PROT 6.9 01/30/2020   ALBUMIN 4.2 01/30/2020   CALCIUM 9.9 01/30/2020   ANIONGAP 8 01/30/2020   No results found for: CHOL No results found for: HDL No results found for: LDLCALC No results found for: TRIG No results found for: CHOLHDL No results found for: HGBA1C     Assessment & Plan:   Problem List Items Addressed This Visit   None    No orders of the defined types were placed in this encounter.    Irene Pap, PA-C

## 2021-07-21 ENCOUNTER — Encounter: Payer: Self-pay | Admitting: Physician Assistant

## 2021-07-22 ENCOUNTER — Encounter: Payer: Self-pay | Admitting: Family Medicine

## 2021-10-26 ENCOUNTER — Telehealth: Payer: Self-pay | Admitting: Family Medicine

## 2021-10-26 NOTE — Telephone Encounter (Signed)
PT called back, begging to come back.  I explained if he could make sure our appt take priority when he schedules then he could come back.  Pt asked where Hetty Blend was and I told him.  He is going to call her and see if she is accepting new patients.

## 2021-10-26 NOTE — Telephone Encounter (Signed)
Returned call to pt as he left message wanting to know why he had been dismissed.  I reached his voice mail and it was full. 03/19/20 no show cpe-called later stated sick 03/16/21  NO SHOW CPE 04/08/21 CANC CPE 07/13/21 CANC VV didn't answer phone 07/17/21 NO SHOW   F/U APPT.

## 2021-12-02 ENCOUNTER — Encounter: Payer: Self-pay | Admitting: Family Medicine

## 2021-12-02 ENCOUNTER — Ambulatory Visit: Payer: No Typology Code available for payment source | Admitting: Family Medicine

## 2021-12-02 VITALS — BP 142/106 | HR 90 | Temp 97.6°F | Ht 70.0 in | Wt 218.0 lb

## 2021-12-02 DIAGNOSIS — E669 Obesity, unspecified: Secondary | ICD-10-CM

## 2021-12-02 DIAGNOSIS — Z136 Encounter for screening for cardiovascular disorders: Secondary | ICD-10-CM

## 2021-12-02 DIAGNOSIS — I10 Essential (primary) hypertension: Secondary | ICD-10-CM | POA: Diagnosis not present

## 2021-12-02 DIAGNOSIS — K219 Gastro-esophageal reflux disease without esophagitis: Secondary | ICD-10-CM

## 2021-12-02 DIAGNOSIS — F411 Generalized anxiety disorder: Secondary | ICD-10-CM

## 2021-12-02 DIAGNOSIS — E781 Pure hyperglyceridemia: Secondary | ICD-10-CM

## 2021-12-02 DIAGNOSIS — Z1211 Encounter for screening for malignant neoplasm of colon: Secondary | ICD-10-CM | POA: Diagnosis not present

## 2021-12-02 DIAGNOSIS — Z87898 Personal history of other specified conditions: Secondary | ICD-10-CM

## 2021-12-02 DIAGNOSIS — E66811 Obesity, class 1: Secondary | ICD-10-CM

## 2021-12-02 HISTORY — DX: Pure hyperglyceridemia: E78.1

## 2021-12-02 LAB — COMPREHENSIVE METABOLIC PANEL WITH GFR
ALT: 24 U/L (ref 0–53)
AST: 21 U/L (ref 0–37)
Albumin: 4.3 g/dL (ref 3.5–5.2)
Alkaline Phosphatase: 52 U/L (ref 39–117)
BUN: 12 mg/dL (ref 6–23)
CO2: 27 meq/L (ref 19–32)
Calcium: 9.4 mg/dL (ref 8.4–10.5)
Chloride: 101 meq/L (ref 96–112)
Creatinine, Ser: 0.9 mg/dL (ref 0.40–1.50)
GFR: 102.95 mL/min
Glucose, Bld: 98 mg/dL (ref 70–99)
Potassium: 3.5 meq/L (ref 3.5–5.1)
Sodium: 137 meq/L (ref 135–145)
Total Bilirubin: 0.7 mg/dL (ref 0.2–1.2)
Total Protein: 7.2 g/dL (ref 6.0–8.3)

## 2021-12-02 LAB — LIPID PANEL
Cholesterol: 219 mg/dL — ABNORMAL HIGH (ref 0–200)
HDL: 34.3 mg/dL — ABNORMAL LOW (ref >39.00)
Total CHOL/HDL Ratio: 6
Triglycerides: 527 mg/dL — ABNORMAL HIGH (ref 0.0–149.0)

## 2021-12-02 LAB — CBC WITH DIFFERENTIAL/PLATELET
Basophils Absolute: 0 10*3/uL (ref 0.0–0.1)
Basophils Relative: 0.8 % (ref 0.0–3.0)
Eosinophils Absolute: 0.3 10*3/uL (ref 0.0–0.7)
Eosinophils Relative: 5.1 % — ABNORMAL HIGH (ref 0.0–5.0)
HCT: 46.4 % (ref 39.0–52.0)
Hemoglobin: 16 g/dL (ref 13.0–17.0)
Lymphocytes Relative: 39.7 % (ref 12.0–46.0)
Lymphs Abs: 2.3 10*3/uL (ref 0.7–4.0)
MCHC: 34.4 g/dL (ref 30.0–36.0)
MCV: 85.7 fl (ref 78.0–100.0)
Monocytes Absolute: 0.5 10*3/uL (ref 0.1–1.0)
Monocytes Relative: 9.1 % (ref 3.0–12.0)
Neutro Abs: 2.7 10*3/uL (ref 1.4–7.7)
Neutrophils Relative %: 45.3 % (ref 43.0–77.0)
Platelets: 161 10*3/uL (ref 150.0–400.0)
RBC: 5.42 Mil/uL (ref 4.22–5.81)
RDW: 12.8 % (ref 11.5–15.5)
WBC: 5.9 10*3/uL (ref 4.0–10.5)

## 2021-12-02 LAB — LDL CHOLESTEROL, DIRECT: Direct LDL: 97 mg/dL

## 2021-12-02 LAB — T4, FREE: Free T4: 0.68 ng/dL (ref 0.60–1.60)

## 2021-12-02 LAB — TSH: TSH: 3.45 u[IU]/mL (ref 0.35–5.50)

## 2021-12-02 MED ORDER — FAMOTIDINE 40 MG PO TABS: 40.0000 mg | ORAL_TABLET | Freq: Every day | ORAL | 1 refills | Status: DC

## 2021-12-02 NOTE — Assessment & Plan Note (Addendum)
BP uncontrolled. Has been controlled in the past with good compliance on losartan-HCTZ 50-12.5 mg. Low salt diet. DASH diet handout. Follow up with BP machine and readings in 5-6 weeks. Check labs and follow up.

## 2021-12-02 NOTE — Patient Instructions (Addendum)
Go downstairs for your labs before you leave today please.  Take your medication consistently.  Eat a low sodium diet.  Increase your physical activity as tolerated.   Follow up in 4-5 weeks with your BP readings from home.   Goal BP is <130/80.   Try the prescription PEPCID for acid reflux.   You can call to schedule your appointment with a psychiatrist.  A few offices are listed below for you to call.   [facility] [address] [place], [place] [address] Phone: [phone]   [facility] [address] [place], [place] [address]  Phone: [phone]  [facility]  [address], [place], [place] [address]  Phone: [phone]    [facility] [address] Sonny Masters [place], [place] [address] [phone]         DASH Eating Plan DASH stands for Dietary Approaches to Stop Hypertension. The DASH eating plan is a healthy eating plan that has been shown to: Reduce high blood pressure (hypertension). Reduce your risk for type 2 diabetes, heart disease, and stroke. Help with weight loss. What are tips for following this plan? Reading food labels Check food labels for the amount of salt (sodium) per serving. Choose foods with less than 5 percent of the Daily Value of sodium. Generally, foods with less than 300 milligrams (mg) of sodium per serving fit into this eating plan. To find whole grains, look for the word "whole" as the first word in the ingredient list. Shopping Buy products labeled as "low-sodium" or "no salt added." Buy fresh foods. Avoid canned foods and pre-made or frozen meals. Cooking Avoid adding salt when cooking. Use salt-free seasonings or herbs instead of table salt or sea salt. Check with your health care provider or pharmacist before using salt substitutes. Do not fry foods. Cook foods using healthy methods  such as baking, boiling, grilling, roasting, and broiling instead. Cook with heart-healthy oils, such as olive, canola, avocado, soybean, or sunflower oil. Meal planning  Eat a balanced diet that includes: 4 or more servings of fruits and 4 or more servings of vegetables each day. Try to fill one-half of your plate with fruits and vegetables. 6-8 servings of whole grains each day. Less than 6 oz (170 g) of lean meat, poultry, or fish each day. A 3-oz (85-g) serving of meat is about the same size as a deck of cards. One egg equals 1 oz (28 g). 2-3 servings of low-fat dairy each day. One serving is 1 cup (237 mL). 1 serving of nuts, seeds, or beans 5 times each week. 2-3 servings of heart-healthy fats. Healthy fats called omega-3 fatty acids are found in foods such as walnuts, flaxseeds, fortified milks, and eggs. These fats are also found in cold-water fish, such as sardines, salmon, and mackerel. Limit how much you eat of: Canned or prepackaged foods. Food that is high in trans fat, such as some fried foods. Food that is high in saturated fat, such as fatty meat. Desserts and other sweets, sugary drinks, and other foods with added sugar. Full-fat dairy products. Do not salt foods before eating. Do not eat more than 4 egg yolks a week. Try to eat at least 2 vegetarian meals a week. Eat more home-cooked food and less restaurant, buffet, and fast food. Lifestyle When eating at a restaurant, ask that your food be prepared with less  salt or no salt, if possible. If you drink alcohol: Limit how much you use to: 0-1 drink a day for women who are not pregnant. 0-2 drinks a day for men. Be aware of how much alcohol is in your drink. In the [place] one drink equals one 12 oz bottle of beer (355 mL), one 5 oz glass of wine (148 mL), or one 1 oz glass of hard liquor (44 mL). General information Avoid eating more than 2,300 mg of salt a day. If you have hypertension, you may need to reduce your sodium  intake to 1,500 mg a day. Work with your health care provider to maintain a healthy body weight or to lose weight. Ask what an ideal weight is for you. Get at least 30 minutes of exercise that causes your heart to beat faster (aerobic exercise) most days of the week. Activities may include walking, swimming, or biking. Work with your health care provider or dietitian to adjust your eating plan to your individual calorie needs. What foods should I eat? Fruits All fresh, dried, or frozen fruit. Canned fruit in natural juice (without added sugar). Vegetables Fresh or frozen vegetables (raw, steamed, roasted, or grilled). Low-sodium or reduced-sodium tomato and vegetable juice. Low-sodium or reduced-sodium tomato sauce and tomato paste. Low-sodium or reduced-sodium canned vegetables. Grains Whole-grain or whole-wheat bread. Whole-grain or whole-wheat pasta. Artiaga rice. Orpah Cobb. Bulgur. Whole-grain and low-sodium cereals. Pita bread. Low-fat, low-sodium crackers. Whole-wheat flour tortillas. Meats and other proteins Skinless chicken or [place]. Ground chicken or [place]. Pork with fat trimmed off. Fish and seafood. Egg whites. Dried beans, peas, or lentils. Unsalted nuts, nut butters, and seeds. Unsalted canned beans. Lean cuts of beef with fat trimmed off. Low-sodium, lean precooked or cured meat, such as sausages or meat loaves. Dairy Low-fat (1%) or fat-free (skim) milk. Reduced-fat, low-fat, or fat-free cheeses. Nonfat, low-sodium ricotta or cottage cheese. Low-fat or nonfat yogurt. Low-fat, low-sodium cheese. Fats and oils Soft margarine without trans fats. Vegetable oil. Reduced-fat, low-fat, or light mayonnaise and salad dressings (reduced-sodium). Canola, safflower, olive, avocado, soybean, and sunflower oils. Avocado. Seasonings and condiments Herbs. Spices. Seasoning mixes without salt. Other foods Unsalted popcorn and pretzels. Fat-free sweets. The items listed above may not be a  complete list of foods and beverages you can eat. Contact a dietitian for more information. What foods should I avoid? Fruits Canned fruit in a light or heavy syrup. Fried fruit. Fruit in cream or butter sauce. Vegetables Creamed or fried vegetables. Vegetables in a cheese sauce. Regular canned vegetables (not low-sodium or reduced-sodium). Regular canned tomato sauce and paste (not low-sodium or reduced-sodium). Regular tomato and vegetable juice (not low-sodium or reduced-sodium). Rosita Fire. Olives. Grains Baked goods made with fat, such as croissants, muffins, or some breads. Dry pasta or rice meal packs. Meats and other proteins Fatty cuts of meat. Ribs. Fried meat. Tomasa Blase. Bologna, salami, and other precooked or cured meats, such as sausages or meat loaves. Fat from the back of a pig (fatback). Bratwurst. Salted nuts and seeds. Canned beans with added salt. Canned or smoked fish. Whole eggs or egg yolks. Chicken or [place] with skin. Dairy Whole or 2% milk, cream, and half-and-half. Whole or full-fat cream cheese. Whole-fat or sweetened yogurt. Full-fat cheese. Nondairy creamers. Whipped toppings. Processed cheese and cheese spreads. Fats and oils Butter. Stick margarine. Lard. Shortening. Ghee. Bacon fat. Tropical oils, such as coconut, palm kernel, or palm oil. Seasonings and condiments Onion salt, garlic salt, seasoned salt, table salt, and sea salt.  Worcestershire sauce. Tartar sauce. Barbecue sauce. Teriyaki sauce. Soy sauce, including reduced-sodium. Steak sauce. Canned and packaged gravies. Fish sauce. Oyster sauce. Cocktail sauce. Store-bought horseradish. Ketchup. Mustard. Meat flavorings and tenderizers. Bouillon cubes. Hot sauces. Pre-made or packaged marinades. Pre-made or packaged taco seasonings. Relishes. Regular salad dressings. Other foods Salted popcorn and pretzels. The items listed above may not be a complete list of foods and beverages you should avoid. Contact a dietitian for  more information. Where to find more information National Heart, Lung, and Blood Institute: PopSteam.is American Heart Association: www.heart.org Academy of Nutrition and Dietetics: www.eatright.org National Kidney Foundation: www.kidney.org Summary The DASH eating plan is a healthy eating plan that has been shown to reduce high blood pressure (hypertension). It may also reduce your risk for type 2 diabetes, heart disease, and stroke. When on the DASH eating plan, aim to eat more fresh fruits and vegetables, whole grains, lean proteins, low-fat dairy, and heart-healthy fats. With the DASH eating plan, you should limit salt (sodium) intake to 2,300 mg a day. If you have hypertension, you may need to reduce your sodium intake to 1,500 mg a day. Work with your health care provider or dietitian to adjust your eating plan to your individual calorie needs. This information is not intended to replace advice given to you by your health care provider. Make sure you discuss any questions you have with your health care provider. Document Revised: 04/27/2019 Document Reviewed: 04/27/2019 Elsevier Patient Education  2023 [employer].

## 2021-12-02 NOTE — Progress Notes (Signed)
New Patient Office Visit  Subjective    Patient ID: Juan Hodge, male    DOB: Jul 14, 1975  Age: 46 y.o. MRN: [id number]  CC:  Chief Complaint  Patient presents with   Establish Care    BP has been high recently     HPI [name] presents to establish care. He is a former patient of mine at [facility].   Here today for HTN. States he is not taking his HTN medication as prescribed.   BP at home has been elevated but not as high as today.   States he has not been exercising or taking care of himself.   Drinking but states he has cut back on alcohol.   Sleeping better in general.   Acid reflux and is not taking anything for it. Certain foods like tomato sauce and pizza makes it worse.   Noted GAD  Is not seeing anyone now for mood or mental health. No medication.   Referred him to cardiologist per his request in 02/2020 but he did not establish with them.   Denies fever, chills, dizziness, chest pain, palpitations, shortness of breath, abdominal pain, N/V/D, urinary symptoms, LE edema.    Outpatient Encounter Medications as of 12/02/2021  Medication Sig   famotidine (PEPCID) 40 MG tablet Take 1 tablet (40 mg total) by mouth daily.   losartan-hydrochlorothiazide (HYZAAR) 50-12.5 MG tablet Take 1 tablet by mouth daily.   [DISCONTINUED] Pseudoephedrine-APAP-DM (DAYQUIL PO) Take 2 capsules by mouth as needed.   No facility-administered encounter medications on file as of 12/02/2021.    Past Medical History:  Diagnosis Date   Alcohol abuse, daily use    Anxiety    Asthma    GERD (gastroesophageal reflux disease)    Hypertension    Panic attacks    Substance abuse (HCC)     Past Surgical History:  Procedure Laterality Date   tubes in ears     as a child    Family History  Problem Relation Age of Onset   Heart disease Father 51       died of cardiac arrest 63   Heart attack Father    High Cholesterol Father     Social History   Socioeconomic History    Marital status: Single    Spouse name: Not on file   Number of children: 0   Years of education: Not on file   Highest education level: Not on file  Occupational History   Occupation: sales  Tobacco Use   Smoking status: Never   Smokeless tobacco: Never  Substance and Sexual Activity   Alcohol use: Yes    Alcohol/week: 0.0 standard drinks of alcohol    Comment: 2-4 glasses of wine. (1/2-1 bottle a night)   Drug use: Yes    Types: Benzodiazepines    Comment: former user of marijuana and cocaine   Sexual activity: Not on file  Other Topics Concern   Not on file  Social History Narrative   Not on file   Social Determinants of Health   Financial Resource Strain: Not on file  Food Insecurity: Not on file  Transportation Needs: Not on file  Physical Activity: Not on file  Stress: Not on file  Social Connections: Not on file  Intimate Partner Violence: Not on file    ROS Pertinent positives and negatives in the history of present illness.      Objective    BP (!) 142/106 (BP Location: Left Arm, Patient Position:  Sitting, Cuff Size: Large)   Pulse 90   Temp 97.6 F (36.4 C) (Temporal)   Ht 5\' 10"  (1.778 m)   Wt 218 lb (98.9 kg)   SpO2 97%   BMI 31.28 kg/m   Physical Exam Alert and in no distress.  Cardiac exam shows a regular rate and rhythm without murmurs or gallops. Lungs are clear to auscultation. No LE edema. Skin is warm and dry. Normal speech, mood and thought process.       Assessment & Plan:   Problem List Items Addressed This Visit       Cardiovascular and Mediastinum   Primary hypertension - Primary    BP uncontrolled. Has been controlled in the past with good compliance on losartan-HCTZ 50-12.5 mg. Low salt diet. DASH diet handout. Follow up with BP machine and readings in 5-6 weeks. Check labs and follow up.       Relevant Orders   CBC with Differential/Platelet (Completed)   Comprehensive metabolic panel (Completed)     Digestive    Esophageal reflux    Avoid triggers and try Pepcid which I prescribed.       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 Recommend establishing with psychiatrist. A list of names provided. He will call.       Relevant Orders   TSH (Completed)   T4, free (Completed)   History of palpitations   Obesity (BMI 30.0-34.9)   Relevant Orders   TSH (Completed)   T4, free (Completed)   Lipid panel (Completed)   Screen for colon cancer   Relevant Orders   Ambulatory referral to Gastroenterology   Other Visit Diagnoses     Screening for heart disease           Return in about 5 weeks (around 01/06/2022) for follow up on HTN.   03/08/2022, NP-C

## 2021-12-02 NOTE — Assessment & Plan Note (Signed)
Avoid triggers and try Pepcid which I prescribed.

## 2021-12-02 NOTE — Assessment & Plan Note (Signed)
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 Recommend establishing with psychiatrist. A list of names provided. He will call.

## 2021-12-16 ENCOUNTER — Other Ambulatory Visit: Payer: Self-pay | Admitting: Family Medicine

## 2021-12-16 DIAGNOSIS — K219 Gastro-esophageal reflux disease without esophagitis: Secondary | ICD-10-CM

## 2022-01-07 ENCOUNTER — Ambulatory Visit: Payer: No Typology Code available for payment source | Admitting: Family Medicine

## 2022-01-07 ENCOUNTER — Encounter: Payer: Self-pay | Admitting: Family Medicine

## 2022-01-07 VITALS — BP 136/96 | HR 70 | Temp 97.6°F | Ht 70.0 in | Wt 215.0 lb

## 2022-01-07 DIAGNOSIS — I10 Essential (primary) hypertension: Secondary | ICD-10-CM

## 2022-01-07 DIAGNOSIS — J3089 Other allergic rhinitis: Secondary | ICD-10-CM

## 2022-01-07 DIAGNOSIS — E781 Pure hyperglyceridemia: Secondary | ICD-10-CM

## 2022-01-07 DIAGNOSIS — J4520 Mild intermittent asthma, uncomplicated: Secondary | ICD-10-CM

## 2022-01-07 DIAGNOSIS — Z1211 Encounter for screening for malignant neoplasm of colon: Secondary | ICD-10-CM

## 2022-01-07 DIAGNOSIS — J45909 Unspecified asthma, uncomplicated: Secondary | ICD-10-CM | POA: Insufficient documentation

## 2022-01-07 LAB — LIPID PANEL
Cholesterol: 205 mg/dL — ABNORMAL HIGH (ref 0–200)
HDL: 32.8 mg/dL — ABNORMAL LOW (ref >39.00)
LDL Cholesterol: 135 mg/dL — ABNORMAL HIGH (ref 0–99)
NonHDL: 172.03
Total CHOL/HDL Ratio: 6
Triglycerides: 183 mg/dL — ABNORMAL HIGH (ref 0.0–149.0)
VLDL: 36.6 mg/dL (ref 0.0–40.0)

## 2022-01-07 MED ORDER — ALBUTEROL SULFATE HFA 108 (90 BASE) MCG/ACT IN AERS: 2.0000 | INHALATION_SPRAY | Freq: Four times a day (QID) | RESPIRATORY_TRACT | 0 refills | Status: DC | PRN

## 2022-01-07 NOTE — Progress Notes (Signed)
Subjective:     Patient ID: Juan Hodge, male    DOB: 06/24/1975, 46 y.o.   MRN: [id number]  Chief Complaint  Patient presents with   Follow-up    F/u on HTN, states it Hodge been doing a little better, thinking his last one was around 140s high 130s over 80s.    HPI Patient is in today for 5 wk follow up on HTN. Reports improved compliance with medication since last visit. He did not take medication today and Hodge forgotten some days.   Triglycerides >500 at his last visit. States he was not fasting but is today  States he Hodge been 3 wks without alcohol and fast food.   Walking 2.5 miles on the treadmill some days.   States he Hodge asthma related to allergies. He Hodge not mentioned this in the past and Hodge been using an old albuterol inhaler.   Denies fever, chills, dizziness, chest pain, palpitations, shortness of breath, abdominal pain, N/V/D, urinary symptoms, LE edema.      Health Maintenance Due  Topic Date Due   Hepatitis C Screening  Never done   TETANUS/TDAP  Never done   COLONOSCOPY (Pts 45-75yrs Insurance coverage will need to be confirmed)  Never done   INFLUENZA VACCINE  01/05/2022    Past Medical History:  Diagnosis Date   Alcohol abuse, daily use    Anxiety    Asthma    GERD (gastroesophageal reflux disease)    Hypertension    Hypertriglyceridemia 12/02/2021   Panic attacks    Substance abuse (HCC)     Past Surgical History:  Procedure Laterality Date   tubes in ears     as a child    Family History  Problem Relation Age of Onset   Heart disease Father 24       died of cardiac arrest 35   Heart attack Father    High Cholesterol Father     Social History   Socioeconomic History   Marital status: Single    Spouse name: Not on file   Number of children: 0   Years of education: Not on file   Highest education level: Not on file  Occupational History   Occupation: sales  Tobacco Use   Smoking status: Never   Smokeless tobacco: Never   Substance and Sexual Activity   Alcohol use: Yes    Alcohol/week: 0.0 standard drinks of alcohol    Comment: 2-4 glasses of wine. (1/2-1 bottle a night)   Drug use: Yes    Types: Benzodiazepines    Comment: former user of marijuana and cocaine   Sexual activity: Not on file  Other Topics Concern   Not on file  Social History Narrative   Not on file   Social Determinants of Health   Financial Resource Strain: Not on file  Food Insecurity: Not on file  Transportation Needs: Not on file  Physical Activity: Not on file  Stress: Not on file  Social Connections: Not on file  Intimate Partner Violence: Not on file    Outpatient Medications Prior to Visit  Medication Sig Dispense Refill   famotidine (PEPCID) 40 MG tablet TAKE 1 TABLET BY MOUTH EVERY DAY 90 tablet 1   losartan-hydrochlorothiazide (HYZAAR) 50-12.5 MG tablet Take 1 tablet by mouth daily. 90 tablet 0   No facility-administered medications prior to visit.    Allergies  Allergen Reactions   Erythromycin Other (See Comments)    Tears stomach up  ROS Pertinent positives and negatives in the history of present illness.     Objective:    Physical Exam Constitutional:      General: He is not in acute distress.    Appearance: He is not ill-appearing.  Cardiovascular:     Rate and Rhythm: Normal rate.  Pulmonary:     Effort: Pulmonary effort is normal.  Neurological:     General: No focal deficit present.     Mental Status: He is alert and oriented to person, place, and time.  Psychiatric:        Mood and Affect: Mood normal.        Behavior: Behavior normal.     BP (!) 136/96 (BP Location: Left Arm, Patient Position: Sitting, Cuff Size: Large)   Pulse 70   Temp 97.6 F (36.4 C) (Temporal)   Ht 5\' 10"  (1.778 m)   Wt 215 lb (97.5 kg)   SpO2 97%   BMI 30.85 kg/m  Wt Readings from Last 3 Encounters:  01/07/22 215 lb (97.5 kg)  12/02/21 218 lb (98.9 kg)  09/24/20 205 lb (93 kg)       Assessment  & Plan:   Problem List Items Addressed This Visit       Cardiovascular and Mediastinum   Primary hypertension - Primary    Medication compliance is still not 100% but improved per patient. He will continue to monitor BP at home. Follow up as needed or in 4 months for CPE and HTN        Respiratory   Extrinsic asthma    Recommend treating allergies. Albuterol prescribed.       Relevant Medications   albuterol (VENTOLIN HFA) 108 (90 Base) MCG/ACT inhaler     Other   Environmental and seasonal allergies    Recommend allergy medication      Hypertriglyceridemia   Relevant Orders   Lipid panel (Completed)   Screen for colon cancer    This will be his first screening colonoscopy       Relevant Orders   Ambulatory referral to Gastroenterology    I am having 09/26/20. Juan Hodge" start on albuterol. I am also having him maintain his losartan-hydrochlorothiazide and famotidine.  Meds ordered this encounter  Medications   albuterol (VENTOLIN HFA) 108 (90 Base) MCG/ACT inhaler    Sig: Inhale 2 puffs into the lungs every 6 (six) hours as needed for wheezing or shortness of breath.    Dispense:  8 g    Refill:  0    Order Specific Question:   Supervising Provider    Answer:   Alberteen Sam A [[id number]]

## 2022-01-07 NOTE — Assessment & Plan Note (Signed)
Recommend allergy medication

## 2022-01-07 NOTE — Assessment & Plan Note (Signed)
This will be his first screening colonoscopy 

## 2022-01-07 NOTE — Patient Instructions (Signed)
If your blood pressures are consistently higher than 130/80, let me know and we will increase your medication dosages.   Keep up the good work with diet and exercise.   I will be in touch with your lab results.     High Triglycerides Eating Plan Triglycerides are a type of fat in the blood. High levels of triglycerides can increase your risk of heart disease and stroke. If your triglyceride levels are high, choosing the right foods can help lower your triglycerides and keep your heart healthy. Work with your health care provider or a dietitian to develop an eating plan that is right for you. What are tips for following this plan? General guidelines  Lose weight, if you are overweight. For most people, losing 5-10 lb (2-5 kg) helps lower triglyceride levels. A weight-loss plan may include: 30 minutes of exercise at least 5 days a week. Reducing the amount of calories, sugar, and fat you eat. Eat a wide variety of fresh fruits, vegetables, and whole grains. These foods are high in fiber. Eat foods that contain healthy fats, such as fatty fish, nuts, seeds, and olive oil. Avoid foods that are high in added sugar, added salt (sodium), and saturated fat. Avoid low-fiber, refined carbohydrates such as white bread, crackers, noodles, and white rice. Avoid foods with trans fats or partially hydrogenated oils, such as fried foods or stick margarine. If you drink alcohol: Limit how much you have to: 0-1 drink a day for women who are not pregnant. 0-2 drinks a day for men. Your health care provider may recommend that you drink less than these amounts depending on your overall health. Know how much alcohol is in a drink. In the [place] one drink equals one 12 oz bottle of beer (355 mL), one 5 oz glass of wine (148 mL), or one 1 oz glass of hard liquor (44 mL). Reading food labels Check food labels for: The amount of saturated fat. Choose foods with no or very little saturated fat (less than 2  g). The amount of trans fat. Choose foods with no transfat. The amount of cholesterol. Choose foods that are low in cholesterol. The amount of sodium. Choose foods with less than 140 milligrams (mg) per serving. Shopping Buy dairy products labeled as nonfat (skim) or low-fat (1%). Avoid buying processed or prepackaged foods. These are often high in added sugar, sodium, and fat. Cooking Choose healthy fats when cooking, such as olive oil, avocado oil, or canola oil. Cook foods using lower fat methods, such as baking, broiling, boiling, or grilling. Make your own sauces, dressings, and marinades when possible, instead of buying them. Store-bought sauces, dressings, and marinades are often high in sodium and sugar. Meal planning Eat more home-cooked food and less restaurant, buffet, and fast food. Eat fatty fish at least 2 times each week. Examples of fatty fish include salmon, trout, sardines, mackerel, tuna, and herring. If you eat whole eggs, do not eat more than 4 egg yolks per week. What foods should I eat? Fruits All fresh, canned (in natural juice), or frozen fruits. Vegetables Fresh or frozen vegetables. Low-sodium canned vegetables. Grains Whole wheat or whole grain breads, crackers, cereals, and pasta. Unsweetened oatmeal. Bulgur. Barley. Quinoa. Weissmann rice. Whole wheat flour tortillas. Meats and other proteins Skinless chicken or [place]. Ground chicken or [place]. Lean cuts of pork, trimmed of fat. Fish and seafood, especially salmon, trout, and herring. Egg whites. Dried beans, peas, or lentils. Unsalted nuts or seeds. Unsalted canned beans. Natural peanut  or almond butter or other nut butters. Dairy Low-fat dairy products. Skim or low-fat (1%) milk. Reduced fat (2%) and low-sodium cheese. Low-fat ricotta cheese. Low-fat cottage cheese. Plain, low-fat yogurt. Fats and oils Tub margarine without trans fats. Light or reduced-fat mayonnaise. Light or reduced-fat salad dressings.  Avocado. Safflower, olive, sunflower, soybean, and canola oils. The items listed above may not be a complete list of recommended foods and beverages. Talk with your dietitian about what dietary choices are best for you. What foods should I avoid? Fruits Sweetened dried fruit. Canned fruit in syrup. Fruit juice. Vegetables Creamed or fried vegetables. Vegetables in a cheese sauce. Grains White bread. White (regular) pasta. White rice. Cornbread. Bagels. Pastries. Crackers that contain trans fat. Meats and other proteins Fatty cuts of meat. Ribs. Chicken wings. Tomasa Blase. Sausage. Bologna. Salami. Chitterlings. Fatback. Hot dogs. Bratwurst. Packaged lunch meats. Dairy Whole or reduced-fat (2%) milk. Half-and-half. Cream cheese. Full-fat or sweetened yogurt. Full-fat cheese. Nondairy creamers. Whipped toppings. Processed cheese or cheese spreads. Cheese curds. Fats and oils Butter. Stick margarine. Lard. Shortening. Ghee. Bacon fat. Tropical oils, such as coconut, palm kernel, or palm oils. Beverages Alcohol. Sweetened drinks, such as soda, lemonade, fruit drinks, or punches. Sweets and desserts Corn syrup. Sugars. Honey. Molasses. Candy. Jam and jelly. Syrup. Sweetened cereals. Cookies. Pies. Cakes. Donuts. Muffins. Ice cream. Condiments Store-bought sauces, dressings, and marinades that are high in sugar, such as ketchup and barbecue sauce. The items listed above may not be a complete list of foods and beverages you should avoid. Talk with your dietitian about what dietary choices are best for you. Summary High levels of triglycerides can increase the risk of heart disease and stroke. Choosing the right foods can help lower your triglycerides. Eat plenty of fresh fruits, vegetables, and whole grains. Choose low-fat dairy and lean meats. Eat fatty fish at least twice a week. Avoid processed and prepackaged foods with added sugar, sodium, saturated fat, and trans fat. If you need suggestions or  have questions about what types of food are good for you, talk with your health care provider or a dietitian. This information is not intended to replace advice given to you by your health care provider. Make sure you discuss any questions you have with your health care provider. Document Revised: 10/03/2020 Document Reviewed: 10/03/2020 Elsevier Patient Education  2023 [employer].

## 2022-01-07 NOTE — Assessment & Plan Note (Signed)
Recommend treating allergies. Albuterol prescribed.

## 2022-01-07 NOTE — Assessment & Plan Note (Signed)
Medication compliance is still not 100% but improved per patient. He will continue to monitor BP at home. Follow up as needed or in 4 months for CPE and HTN

## 2022-01-20 ENCOUNTER — Other Ambulatory Visit: Payer: Self-pay | Admitting: Medical

## 2022-01-20 ENCOUNTER — Other Ambulatory Visit: Payer: Self-pay | Admitting: Family Medicine

## 2022-01-20 DIAGNOSIS — I10 Essential (primary) hypertension: Secondary | ICD-10-CM

## 2022-02-13 ENCOUNTER — Other Ambulatory Visit: Payer: Self-pay | Admitting: Family Medicine

## 2022-02-13 DIAGNOSIS — J4520 Mild intermittent asthma, uncomplicated: Secondary | ICD-10-CM

## 2022-02-15 NOTE — Telephone Encounter (Signed)
Ok for pt to continue on this?

## 2022-03-03 ENCOUNTER — Ambulatory Visit (AMBULATORY_SURGERY_CENTER): Payer: Self-pay | Admitting: *Deleted

## 2022-03-03 VITALS — Ht 70.0 in | Wt 213.8 lb

## 2022-03-03 DIAGNOSIS — Z1211 Encounter for screening for malignant neoplasm of colon: Secondary | ICD-10-CM

## 2022-03-03 NOTE — Progress Notes (Signed)
No egg or soy allergy known to patient  No issues known to pt with past sedation with any surgeries or procedures Patient denies ever being told they had issues or difficulty with intubation  No FH of Malignant Hyperthermia Pt is not on diet pills Pt is not on  home 02  Pt is not on blood thinners  Pt denies issues with constipation  No A fib or A flutter Have any cardiac testing pending--no Pt instructed to use Singlecare.com or GoodRx for a price reduction on prep    Sample sheet of over the counter items to purchase for prep given to pt. 

## 2022-03-18 ENCOUNTER — Encounter: Payer: Self-pay | Admitting: Internal Medicine

## 2022-03-24 NOTE — Progress Notes (Unsigned)
[facility] History and Physical   Primary Care Physician:  Girtha Rm, NP-C   Reason for Procedure:   CRCA screening  Plan:    colonoscopy     HPI: Juan Hodge is a 46 y.o. male presenting for first screening colonoscpy   Past Medical History:  Diagnosis Date   Alcohol abuse, daily use    Allergy    Anxiety    Asthma    GERD (gastroesophageal reflux disease)    Hypertension    Hypertriglyceridemia 12/02/2021   Panic attacks    Substance abuse ([place])     Past Surgical History:  Procedure Laterality Date   tubes in ears     as a child    Prior to Admission medications   Medication Sig Start Date End Date Taking? Authorizing Provider  albuterol (VENTOLIN HFA) 108 (90 Base) MCG/ACT inhaler TAKE 2 PUFFS BY MOUTH EVERY 6 HOURS AS NEEDED FOR WHEEZE OR SHORTNESS OF BREATH Patient not taking: Reported on 03/03/2022 02/15/22   Harland Dingwall L, NP-C  famotidine (PEPCID) 40 MG tablet TAKE 1 TABLET BY MOUTH EVERY DAY Patient not taking: Reported on 03/03/2022 12/23/21   Girtha Rm, NP-C  losartan-hydrochlorothiazide (HYZAAR) 50-12.5 MG tablet TAKE 1 TABLET BY MOUTH EVERY DAY 01/20/22   Girtha Rm, NP-C    Current Outpatient Medications  Medication Sig Dispense Refill   albuterol (VENTOLIN HFA) 108 (90 Base) MCG/ACT inhaler TAKE 2 PUFFS BY MOUTH EVERY 6 HOURS AS NEEDED FOR WHEEZE OR SHORTNESS OF BREATH 8.5 each 0   losartan-hydrochlorothiazide (HYZAAR) 50-12.5 MG tablet TAKE 1 TABLET BY MOUTH EVERY DAY 90 tablet 0   famotidine (PEPCID) 40 MG tablet TAKE 1 TABLET BY MOUTH EVERY DAY (Patient not taking: Reported on 03/03/2022) 90 tablet 1   Current Facility-Administered Medications  Medication Dose Route Frequency Provider Last Rate Last Admin   0.9 %  sodium chloride infusion  500 mL Intravenous Once Gatha Mayer, MD        Allergies as of 03/25/2022 - Review Complete 03/25/2022  Allergen Reaction Noted   Erythromycin Other (See Comments)  11/25/2015    Family History  Problem Relation Age of Onset   Heart disease Father 62       died of cardiac arrest 2070-10-03   Heart attack Father    High Cholesterol Father    Colon cancer Neg Hx    Colon polyps Neg Hx    Crohn's disease Neg Hx    Esophageal cancer Neg Hx    Rectal cancer Neg Hx    Stomach cancer Neg Hx    Ulcerative colitis Neg Hx     Social History   Socioeconomic History   Marital status: Single    Spouse name: Not on file   Number of children: 0   Years of education: Not on file   Highest education level: Not on file  Occupational History   Occupation: sales  Tobacco Use   Smoking status: Never    Passive exposure: Never   Smokeless tobacco: Never  Vaping Use   Vaping Use: Never used  Substance and Sexual Activity   Alcohol use: Yes    Comment: 2-4 glasses of wine. (1/2-1 bottle a night) 1-2 per week   Drug use: Yes    Types: Benzodiazepines, Marijuana    Comment: former user of marijuana smoke daily and cocaine   Sexual activity: Not on file  Other Topics Concern   Not on file  Social History Narrative  Not on file   Social Determinants of Health   Financial Resource Strain: Not on file  Food Insecurity: Not on file  Transportation Needs: Not on file  Physical Activity: Not on file  Stress: Not on file  Social Connections: Not on file  Intimate Partner Violence: Not on file    Review of Systems:  All other review of systems negative except as mentioned in the HPI.  Physical Exam: Vital signs BP (!) 153/111   Pulse 83   Temp 98.7 F (37.1 C) (Temporal)   Ht 5\' 10"  (1.778 m)   Wt 213 lb 12.8 oz (97 kg)   SpO2 97%   BMI 30.68 kg/m   General:   Alert,  Well-developed, well-nourished, pleasant and cooperative in NAD Lungs:  Clear throughout to auscultation.   Heart:  Regular rate and rhythm; no murmurs, clicks, rubs,  or gallops. Abdomen:  Soft, nontender and nondistended. Normal bowel sounds.   Neuro/Psych:  Alert and  cooperative. Normal mood and affect. A and O x 3   @Brockton Mckesson  , MD, [facility] [phone] (pager) 03/25/2022 9:39 AM@

## 2022-03-25 ENCOUNTER — Ambulatory Visit (AMBULATORY_SURGERY_CENTER): Payer: No Typology Code available for payment source | Admitting: Internal Medicine

## 2022-03-25 ENCOUNTER — Encounter: Payer: Self-pay | Admitting: Internal Medicine

## 2022-03-25 VITALS — BP 127/93 | HR 65 | Temp 98.7°F | Resp 17 | Ht 70.0 in | Wt 213.8 lb

## 2022-03-25 DIAGNOSIS — Z1211 Encounter for screening for malignant neoplasm of colon: Secondary | ICD-10-CM

## 2022-03-25 MED ORDER — SODIUM CHLORIDE 0.9 % IV SOLN: 500.0000 mL | Freq: Once | INTRAVENOUS | Status: DC

## 2022-03-25 NOTE — Progress Notes (Signed)
VS completed by CW.   Pt's states no medical or surgical changes since previsit or office visit.  

## 2022-03-25 NOTE — Progress Notes (Signed)
Report to PACU, RN, vss, BBS= Clear.  

## 2022-03-25 NOTE — Patient Instructions (Addendum)
No polyps or cancer were seen.  You do have diverticulosis - thickened muscle rings and pouches in the colon wall. Please read the handout about this condition.  Next routine colonoscopy or other screening test in 10 years - 2033.  I appreciate the opportunity to care for you. Gatha Mayer, MD, [facility]  Handout given for diverticulosis.  There were no polyps seen today!  You will need another screening colonoscopy in 10 years, you will receive a letter at that time when you are due for the procedure.   Please call us at [phone] if you have a change in bowel habits, change in family history of colo-rectal cancer, rectal bleeding or other GI concern before that time.    YOU HAD AN ENDOSCOPIC PROCEDURE TODAY AT [facility]:   Refer to the procedure report that was given to you for any specific questions about what was found during the examination.  If the procedure report does not answer your questions, please call your gastroenterologist to clarify.  If you requested that your care partner not be given the details of your procedure findings, then the procedure report has been included in a sealed envelope for you to review at your convenience later.  YOU SHOULD EXPECT: Some feelings of bloating in the abdomen. Passage of more gas than usual.  Walking can help get rid of the air that was put into your GI tract during the procedure and reduce the bloating. If you had a lower endoscopy (such as a colonoscopy or flexible sigmoidoscopy) you may notice spotting of blood in your stool or on the toilet paper. If you underwent a bowel prep for your procedure, you may not have a normal bowel movement for a few days.  Please Note:  You might notice some irritation and congestion in your nose or some drainage.  This is from the oxygen used during your procedure.  There is no need for concern and it should clear up in a day or so.  SYMPTOMS TO REPORT IMMEDIATELY:  Following lower  endoscopy (colonoscopy):  Excessive amounts of blood in the stool  Significant tenderness or worsening of abdominal pains  Swelling of the abdomen that is new, acute  Fever of 100F or higher  For urgent or emergent issues, a gastroenterologist can be reached at any hour by calling [phone]. Do not use MyChart messaging for urgent concerns.    DIET:  We do recommend a small meal at first, but then you may proceed to your regular diet.  Drink plenty of fluids but you should avoid alcoholic beverages for 24 hours.  ACTIVITY:  You should plan to take it easy for the rest of today and you should NOT DRIVE or use heavy machinery until tomorrow (because of the sedation medicines used during the test).    FOLLOW UP: Our staff will call the number listed on your records the next business day following your procedure.  We will call around 7:15- 8:00 am to check on you and address any questions or concerns that you may have regarding the information given to you following your procedure. If we do not reach you, we will leave a message.      SIGNATURES/CONFIDENTIALITY: You and/or your care partner have signed paperwork which will be entered into your electronic medical record.  These signatures attest to the fact that that the information above on your After Visit Summary has been reviewed and is understood.  Full responsibility of the confidentiality  of this discharge information lies with you and/or your care-partner.

## 2022-03-25 NOTE — Op Note (Signed)
[facility] Patient Name: Juan Hodge Procedure Date: 03/25/2022 9:41 AM MRN: [id number] Endoscopist: Gatha Mayer , MD Age: 46 Referring MD:  Date of Birth: [birth year] Gender: Male Account #: [id number] Procedure:                Colonoscopy Indications:              Screening for colorectal malignant neoplasm, This                            is the patient's first colonoscopy Medicines:                Monitored Anesthesia Care Procedure:                Pre-Anesthesia Assessment:                           - Prior to the procedure, a History and Physical                            was performed, and patient medications and                            allergies were reviewed. The patient's tolerance of                            previous anesthesia was also reviewed. The risks                            and benefits of the procedure and the sedation                            options and risks were discussed with the patient.                            All questions were answered, and informed consent                            was obtained. Prior Anticoagulants: The patient has                            taken no previous anticoagulant or antiplatelet                            agents. ASA Grade Assessment: II - A patient with                            mild systemic disease. After reviewing the risks                            and benefits, the patient was deemed in                            satisfactory condition to undergo the procedure.  After obtaining informed consent, the colonoscope                            was passed under direct vision. Throughout the                            procedure, the patient's blood pressure, pulse, and                            oxygen saturations were monitored continuously. The                            Colonoscope was introduced through the anus and                            advanced to the the cecum,  identified by                            appendiceal orifice and ileocecal valve. The                            colonoscopy was performed without difficulty. The                            patient tolerated the procedure well. The quality                            of the bowel preparation was good. The bowel                            preparation used was Miralax via split dose                            instruction. The ileocecal valve, appendiceal                            orifice, and rectum were photographed. Scope In: 9:46:48 AM Scope Out: 9:55:46 AM Scope Withdrawal Time: 0 hours 7 minutes 17 seconds  Total Procedure Duration: 0 hours 8 minutes 58 seconds  Findings:                 The perianal and digital rectal examinations were                            normal. Pertinent negatives include normal prostate                            (size, shape, and consistency).                           Multiple small and large-mouthed diverticula were                            found in the sigmoid colon.  The exam was otherwise without abnormality on                            direct and retroflexion views. Complications:            No immediate complications. Estimated Blood Loss:     Estimated blood loss: none. Impression:               - Diverticulosis in the sigmoid colon.                           - The examination was otherwise normal on direct                            and retroflexion views.                           - No specimens collected. Recommendation:           - Patient has a contact number available for                            emergencies. The signs and symptoms of potential                            delayed complications were discussed with the                            patient. Return to normal activities tomorrow.                            Written discharge instructions were provided to the                            patient.                            - Resume previous diet.                           - Continue present medications.                           - Repeat colonoscopy in 10 years for screening                            purposes. Iva Boop, MD 03/25/2022 10:00:38 AM This report has been signed electronically.

## 2022-03-26 ENCOUNTER — Telehealth: Payer: Self-pay

## 2022-03-26 NOTE — Telephone Encounter (Signed)
  Follow up Call-     03/25/2022    8:51 AM  Call back number  Post procedure Call Back phone  # [phone]  Permission to leave phone message Yes    Follow up call, LVM

## 2022-04-08 ENCOUNTER — Ambulatory Visit: Payer: No Typology Code available for payment source | Admitting: Orthopedic Surgery

## 2022-04-08 ENCOUNTER — Ambulatory Visit (INDEPENDENT_AMBULATORY_CARE_PROVIDER_SITE_OTHER): Payer: No Typology Code available for payment source

## 2022-04-08 ENCOUNTER — Ambulatory Visit: Payer: No Typology Code available for payment source | Admitting: Family Medicine

## 2022-04-08 ENCOUNTER — Encounter: Payer: Self-pay | Admitting: Orthopedic Surgery

## 2022-04-08 VITALS — BP 135/97 | HR 76 | Ht 70.0 in | Wt 215.0 lb

## 2022-04-08 DIAGNOSIS — M545 Low back pain, unspecified: Secondary | ICD-10-CM

## 2022-04-08 NOTE — Progress Notes (Signed)
Orthopedic Spine Surgery Office Note  Assessment: Patient is a 46 y.o. male with low back pain that radiates into bilateral posterior thighs   Plan: -Explained that initially conservative treatment is tried as a significant number of patients may experience relief with these treatment modalities. Discussed that the conservative treatments include:  -activity modification  -physical therapy  -over the counter pain medications  -medrol dosepak  -lumbar steroid injections -Patient has tried activity modification and ibuprofen.  Has historically tried chiropractor, massage, and topical medications -Recommended physical therapy and core strengthening.  He can use ibuprofen and Voltaren gel as well -If he does not get any relief with physical therapy, will recommend MRI of the lumbar spine at the next visit -Patient should return to office in 6 weeks, repeat x-rays of lumbar spine at next visit: scoli films   Patient expressed understanding of the plan and all questions were answered to the patient's satisfaction.   ___________________________________________________________________________   History:  Patient is a 46 y.o. male who presents today for lumbar spine.  Patient has had 25 years of low back pain.  There is no trauma or injury that started this back pain.  Pain is gotten worse with time.  It is gotten particularly worse within the last week.  He had to lay in bed because he is having significant pain.  Recently, pain has started to radiate into his bilateral posterior thighs.  It started on the left side and then within the last few months it is now radiating down the right side as well.  It sometimes radiates past the knee.  He is feeling this pain in his extremities more regularly as time is gone on.  He used to get relief with topical medication, ibuprofen and massage.   Weakness: Denies Symptoms of imbalance: Denies Paresthesias and numbness: Denies Bowel or bladder  incontinence: Denies Saddle anesthesia: Denies  Treatments tried: Topical medication, activity modification, ibuprofen, massage, chiropractor  Review of systems: Denies fevers and chills, night sweats, unexplained weight loss, history of cancer, pain that wakes them at night  Past medical history: Hyperlipidemia Hypertension GERD Anxiety  Allergies: Erythromycin  Past surgical history:  None  Social history: Denies use of nicotine product (smoking, vaping, patches, smokeless) Alcohol use: Yes, 4/week Denies recreational drug use   Physical Exam:  General: no acute distress, appears stated age Neurologic: alert, answering questions appropriately, following commands Respiratory: unlabored breathing on room air, symmetric chest rise Psychiatric: appropriate affect, normal cadence to speech   MSK (spine):  -Strength exam      Left  Right EHL    5/5  5/5 TA    5/5  5/5 GSC    5/5  5/5 Knee extension  5/5  5/5 Hip flexion   5/5  5/5  -Sensory exam    Sensation intact to light touch in L3-S1 nerve distributions of bilateral lower extremities  -Achilles DTR: 2/4 on the left, 2/4 on the right -Patellar tendon DTR: 2/4 on the left, 2/4 on the right  -Straight leg raise: Negative -Contralateral straight leg raise: Negative -Clonus: no beats bilaterally  -Left hip exam: No pain through range of motion, negative Stinchfield, negative Faber -Right hip exam: No pain through range of motion, negative Stinchfield, negative Faber  Imaging: XR of the lumbar spine from 04/08/2022 was independently reviewed and interpreted, showing disc height loss at L4/5 and L5/S1. No fracture or dislocation. Has PI of 46 and LL of 25 on these films (would need scoli film to better  evaluate). No evidence of instability on flexion/extension views.    Patient name: Juan Hodge Patient MRN: [id number] Date of visit: 04/08/22

## 2022-04-14 ENCOUNTER — Ambulatory Visit: Payer: No Typology Code available for payment source | Admitting: Physical Therapy

## 2022-05-03 ENCOUNTER — Ambulatory Visit: Payer: No Typology Code available for payment source | Admitting: Emergency Medicine

## 2022-05-03 ENCOUNTER — Encounter: Payer: Self-pay | Admitting: Emergency Medicine

## 2022-05-03 VITALS — BP 132/84 | HR 87 | Temp 98.5°F | Ht 70.0 in | Wt 213.5 lb

## 2022-05-03 DIAGNOSIS — H6691 Otitis media, unspecified, right ear: Secondary | ICD-10-CM | POA: Insufficient documentation

## 2022-05-03 DIAGNOSIS — J069 Acute upper respiratory infection, unspecified: Secondary | ICD-10-CM

## 2022-05-03 MED ORDER — CEFUROXIME AXETIL 500 MG PO TABS: 500.0000 mg | ORAL_TABLET | Freq: Two times a day (BID) | ORAL | 0 refills | Status: AC

## 2022-05-03 NOTE — Patient Instructions (Signed)
Otitis Media, Adult  Otitis media is a condition in which the middle ear is red and swollen (inflamed) and full of fluid. The middle ear is the part of the ear that contains bones for hearing as well as air that helps send sounds to the brain. The condition usually goes away on its own. What are the causes? This condition is caused by a blockage in the eustachian tube. This tube connects the middle ear to the back of the nose. It normally allows air into the middle ear. The blockage is caused by fluid or swelling. Problems that can cause blockage include: A cold or infection that affects the nose, mouth, or throat. Allergies. An irritant, such as tobacco smoke. Adenoids that have become large. The adenoids are soft tissue located in the back of the throat, behind the nose and the roof of the mouth. Growth or swelling in the upper part of the throat, just behind the nose (nasopharynx). Damage to the ear caused by a change in pressure. This is called barotrauma. What increases the risk? You are more likely to develop this condition if you: Smoke or are exposed to tobacco smoke. Have an opening in the roof of your mouth (cleft palate). Have acid reflux. Have problems in your body's defense system (immune system). What are the signs or symptoms? Symptoms of this condition include: Ear pain. Fever. Problems with hearing. Being tired. Fluid leaking from the ear. Ringing in the ear. How is this treated? This condition can go away on its own within 3-5 days. But if the condition is caused by germs (bacteria) and does not go away on its own, or if it keeps coming back, your doctor may: Give you antibiotic medicines. Give you medicines for pain. Follow these instructions at home: Take over-the-counter and prescription medicines only as told by your doctor. If you were prescribed an antibiotic medicine, take it as told by your doctor. Do not stop taking it even if you start to feel better. Keep  all follow-up visits. Contact a doctor if: You have bleeding from your nose. There is a lump on your neck. You are not feeling better in 5 days. You feel worse instead of better. Get help right away if: You have pain that is not helped with medicine. You have swelling, redness, or pain around your ear. You get a stiff neck. You cannot move part of your face (paralysis). You notice that the bone behind your ear hurts when you touch it. You get a very bad headache. Summary Otitis media means that the middle ear is red, swollen, and full of fluid. This condition usually goes away on its own. If the problem does not go away, treatment may be needed. You may be given medicines to treat the infection or to treat your pain. If you were prescribed an antibiotic medicine, take it as told by your doctor. Do not stop taking it even if you start to feel better. Keep all follow-up visits. This information is not intended to replace advice given to you by your health care provider. Make sure you discuss any questions you have with your health care provider. Document Revised: 09/01/2020 Document Reviewed: 09/01/2020 Elsevier Patient Education  2023 Elsevier Inc.  

## 2022-05-03 NOTE — Assessment & Plan Note (Signed)
Running its course but with right ear complication

## 2022-05-03 NOTE — Assessment & Plan Note (Signed)
Partially treated with 7-day course of amoxicillin.  Still present. Recommend Ceftin 500 mg twice a day for 7 days.

## 2022-05-03 NOTE — Progress Notes (Signed)
Juan Hodge 46 y.o.   Chief Complaint  Patient presents with   Acute Visit    Congestion, cough ,rt ear pain/ clogged x 2 months     HISTORY OF PRESENT ILLNESS: This is a 46 y.o. male complaining of persistent pain to right ear. Developed upper respiratory tract symptoms about 2 months ago.  Still has some mild cough and congestion.  Most of the symptoms have improved however right ear discomfort still remains.  Has history of ear infections in the past.  Recently traveled and the change in pressure made it better.  Was prescribed a 7-day course of amoxicillin which he took.  Hodge better for several days but now symptoms to right ear are back.  No other associated symptoms. No other complaints or medical concerns today.  HPI   Prior to Admission medications   Medication Sig Start Date End Date Taking? Authorizing Provider  albuterol (VENTOLIN HFA) 108 (90 Base) MCG/ACT inhaler TAKE 2 PUFFS BY MOUTH EVERY 6 HOURS AS NEEDED FOR WHEEZE OR SHORTNESS OF BREATH 02/15/22  Yes Henson, Vickie L, NP-C  losartan-hydrochlorothiazide (HYZAAR) 50-12.5 MG tablet TAKE 1 TABLET BY MOUTH EVERY DAY 01/20/22  Yes Henson, Vickie L, NP-C  famotidine (PEPCID) 40 MG tablet TAKE 1 TABLET BY MOUTH EVERY DAY Patient not taking: Reported on 03/03/2022 12/23/21   Hetty Blend L, NP-C    Allergies  Allergen Reactions   Erythromycin Other (See Comments)    Tears stomach up    Patient Active Problem List   Diagnosis Date Noted   Extrinsic asthma 01/07/2022   Environmental and seasonal allergies 01/07/2022   History of palpitations 12/02/2021   Screen for colon cancer 12/02/2021   Obesity (BMI 30.0-34.9) 12/02/2021   Hypertriglyceridemia 12/02/2021   Transient vision disturbance of right eye 02/12/2020   Long-term current use of benzodiazepine 02/12/2020   Insomnia 02/12/2020   Acute conjunctivitis of right eye 05/08/2018   Primary hypertension 05/08/2018   Noncompliance 05/08/2018   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   Esophageal reflux 12/18/2015   Alcohol abuse, daily use 12/18/2015   Anxiety disorder 12/18/2015   Panic attacks 12/18/2015    Past Medical History:  Diagnosis Date   Alcohol abuse, daily use    Allergy    Anxiety    Asthma    GERD (gastroesophageal reflux disease)    Hypertension    Hypertriglyceridemia 12/02/2021   Panic attacks    Substance abuse (HCC)     Past Surgical History:  Procedure Laterality Date   tubes in ears     as a child    Social History   Socioeconomic History   Marital status: Single    Spouse name: Not on file   Number of children: 0   Years of education: Not on file   Highest education level: Not on file  Occupational History   Occupation: sales  Tobacco Use   Smoking status: Never    Passive exposure: Never   Smokeless tobacco: Never  Vaping Use   Vaping Use: Never used  Substance and Sexual Activity   Alcohol use: Yes    Comment: 2-4 glasses of wine. (1/2-1 bottle a night) 1-2 per week   Drug use: Yes    Types: Benzodiazepines, Marijuana    Comment: former user of marijuana smoke daily and cocaine   Sexual activity: Not on file  Other Topics Concern   Not on file  Social History Narrative   Not on file   Social  Determinants of Health   Financial Resource Strain: Not on file  Food Insecurity: Not on file  Transportation Needs: Not on file  Physical Activity: Not on file  Stress: Not on file  Social Connections: Not on file  Intimate Partner Violence: Not on file    Family History  Problem Relation Age of Onset   Heart disease Father 39       died of cardiac arrest 26   Heart attack Father    High Cholesterol Father    Colon cancer Neg Hx    Colon polyps Neg Hx    Crohn's disease Neg Hx    Esophageal cancer Neg Hx    Rectal cancer Neg Hx    Stomach cancer Neg Hx    Ulcerative colitis Neg Hx      Review of Systems  Constitutional: Negative.  Negative for chills and fever.   HENT:  Positive for ear pain.   Respiratory: Negative.  Negative for cough, hemoptysis and shortness of breath.   Cardiovascular: Negative.  Negative for chest pain and palpitations.  Gastrointestinal:  Negative for nausea and vomiting.  Genitourinary: Negative.   Neurological: Negative.  Negative for dizziness and headaches.   Today's Vitals   05/03/22 1456  BP: 132/84  Pulse: 87  Temp: 98.5 F (36.9 C)  TempSrc: Oral  SpO2: 95%  Weight: 213 lb 8 oz (96.8 kg)  Height: 5\' 10"  (1.778 m)   Body mass index is 30.63 kg/m.   Physical Exam Vitals reviewed.  Constitutional:      Appearance: Normal appearance.  HENT:     Head: Normocephalic.     Right Ear: External ear normal. A middle ear effusion is present. Tympanic membrane is injected and erythematous.     Left Ear: Tympanic membrane, ear canal and external ear normal.     Mouth/Throat:     Mouth: Mucous membranes are moist.     Pharynx: Oropharynx is clear.  Eyes:     Extraocular Movements: Extraocular movements intact.     Pupils: Pupils are equal, round, and reactive to light.  Cardiovascular:     Rate and Rhythm: Normal rate and regular rhythm.     Pulses: Normal pulses.     Heart sounds: Normal heart sounds.  Pulmonary:     Effort: Pulmonary effort is normal.     Breath sounds: Normal breath sounds.  Musculoskeletal:     Cervical back: No tenderness.  Lymphadenopathy:     Cervical: No cervical adenopathy.  Skin:    General: Skin is warm and dry.  Neurological:     General: No focal deficit present.     Mental Status: He is alert and oriented to person, place, and time.  Psychiatric:        Mood and Affect: Mood normal.        Behavior: Behavior normal.      ASSESSMENT & PLAN: A total of 33 minutes was spent with the patient and counseling/coordination of care regarding preparing for this visit, review of most recent office visit notes, review of chronic medical problems under management, review of all  medications, diagnosis of right otitis media and need for antibiotics, prognosis, documentation, and need for follow-up if no better or worse during the next several days..  Problem List Items Addressed This Visit       Respiratory   Upper respiratory infection, viral    Running its course but with right ear complication      Relevant Medications  cefUROXime (CEFTIN) 500 MG tablet     Nervous and Auditory   Right otitis media - Primary    Partially treated with 7-day course of amoxicillin.  Still present. Recommend Ceftin 500 mg twice a day for 7 days.      Relevant Medications   cefUROXime (CEFTIN) 500 MG tablet   Patient Instructions  Otitis Media, Adult  Otitis media is a condition in which the middle ear is red and swollen (inflamed) and full of fluid. The middle ear is the part of the ear that contains bones for hearing as well as air that helps send sounds to the brain. The condition usually goes away on its own. What are the causes? This condition is caused by a blockage in the eustachian tube. This tube connects the middle ear to the back of the nose. It normally allows air into the middle ear. The blockage is caused by fluid or swelling. Problems that can cause blockage include: A cold or infection that affects the nose, mouth, or throat. Allergies. An irritant, such as tobacco smoke. Adenoids that have become large. The adenoids are soft tissue located in the back of the throat, behind the nose and the roof of the mouth. Growth or swelling in the upper part of the throat, just behind the nose (nasopharynx). Damage to the ear caused by a change in pressure. This is called barotrauma. What increases the risk? You are more likely to develop this condition if you: Smoke or are exposed to tobacco smoke. Have an opening in the roof of your mouth (cleft palate). Have acid reflux. Have problems in your body's defense system (immune system). What are the signs or  symptoms? Symptoms of this condition include: Ear pain. Fever. Problems with hearing. Being tired. Fluid leaking from the ear. Ringing in the ear. How is this treated? This condition can go away on its own within 3-5 days. But if the condition is caused by germs (bacteria) and does not go away on its own, or if it keeps coming back, your doctor may: Give you antibiotic medicines. Give you medicines for pain. Follow these instructions at home: Take over-the-counter and prescription medicines only as told by your doctor. If you were prescribed an antibiotic medicine, take it as told by your doctor. Do not stop taking it even if you start to feel better. Keep all follow-up visits. Contact a doctor if: You have bleeding from your nose. There is a lump on your neck. You are not feeling better in 5 days. You feel worse instead of better. Get help right away if: You have pain that is not helped with medicine. You have swelling, redness, or pain around your ear. You get a stiff neck. You cannot move part of your face (paralysis). You notice that the bone behind your ear hurts when you touch it. You get a very bad headache. Summary Otitis media means that the middle ear is red, swollen, and full of fluid. This condition usually goes away on its own. If the problem does not go away, treatment may be needed. You may be given medicines to treat the infection or to treat your pain. If you were prescribed an antibiotic medicine, take it as told by your doctor. Do not stop taking it even if you start to feel better. Keep all follow-up visits. This information is not intended to replace advice given to you by your health care provider. Make sure you discuss any questions you have with your health care  provider. Document Revised: 09/01/2020 Document Reviewed: 09/01/2020 Elsevier Patient Education  2023 Elsevier Inc.    Edwina Barth, MD [facility]

## 2022-05-07 ENCOUNTER — Telehealth: Payer: Self-pay | Admitting: Family Medicine

## 2022-05-07 DIAGNOSIS — H6691 Otitis media, unspecified, right ear: Secondary | ICD-10-CM

## 2022-05-07 NOTE — Telephone Encounter (Signed)
Patient is still having problems with his ear and does not feel like the medication has helped - he would like to know what he should do and if he can be referred to an ENT

## 2022-05-07 NOTE — Telephone Encounter (Signed)
Called pt and made aware that ENT referral has been sent in so he should hear back from them by next week

## 2022-05-07 NOTE — Telephone Encounter (Signed)
Spoke to pt in regards to their concern about his ear infection not getting any better. Let pt know that based on his visit on Monday the provider he saw Dr. Alvy Bimler prescribed him an antibiotics for 7 days to help with it, so he should wait for the seven days and by then id not getting better to call Monday but will send his provider notes about him wanting a referral for it. Pt got mad on the phone ( raise his voice sated that he has been having this issue and not feeling well in about two month and no one seems to wanting to do anything about it. Let pt know that I was not aware about his other concerns, the one I was aware of was the ear issue which he called earlier about. Pt stated he want the referral to ENT because they can fix his concerns. Let pt know that his provider is not in the office at today but will send her notes in regards to his referral, pt then wanted the provider he saw on 05/03/22 to do the referral. Made pt aware that provider is not in the office at the moment so he will have to wait for Monday when his provider is here. Pt wanted other provider to the referral, let pt know other provider can not do the referral since they are not the one he saw or his provider so by Monday we will call him about it once his provider is in the office.

## 2022-05-10 ENCOUNTER — Telehealth: Payer: Self-pay | Admitting: Family Medicine

## 2022-05-10 NOTE — Telephone Encounter (Signed)
error 

## 2022-05-12 ENCOUNTER — Encounter: Payer: Self-pay | Admitting: Family Medicine

## 2022-05-12 ENCOUNTER — Ambulatory Visit (INDEPENDENT_AMBULATORY_CARE_PROVIDER_SITE_OTHER): Payer: No Typology Code available for payment source

## 2022-05-12 ENCOUNTER — Ambulatory Visit (INDEPENDENT_AMBULATORY_CARE_PROVIDER_SITE_OTHER): Payer: No Typology Code available for payment source | Admitting: Family Medicine

## 2022-05-12 VITALS — BP 120/86 | HR 70 | Temp 97.6°F | Ht 70.0 in | Wt 208.0 lb

## 2022-05-12 DIAGNOSIS — H6991 Unspecified Eustachian tube disorder, right ear: Secondary | ICD-10-CM

## 2022-05-12 DIAGNOSIS — J4530 Mild persistent asthma, uncomplicated: Secondary | ICD-10-CM

## 2022-05-12 DIAGNOSIS — R058 Other specified cough: Secondary | ICD-10-CM | POA: Diagnosis not present

## 2022-05-12 DIAGNOSIS — R062 Wheezing: Secondary | ICD-10-CM | POA: Diagnosis not present

## 2022-05-12 MED ORDER — METHYLPREDNISOLONE ACETATE 80 MG/ML IJ SUSP
80.0000 mg | Freq: Once | INTRAMUSCULAR | Status: AC
  Administered 2022-05-12: 80 mg via INTRAMUSCULAR

## 2022-05-12 MED ORDER — AMOXICILLIN-POT CLAVULANATE 875-125 MG PO TABS: 1.0000 | ORAL_TABLET | Freq: Two times a day (BID) | ORAL | 0 refills | Status: DC

## 2022-05-12 NOTE — Patient Instructions (Addendum)
Please go downstairs for a chest X ray before you leave.   You received a steroid injection today.   Take the antibiotic as prescribed. Drink plenty of water.   Use Flonase and take Claritin or Xyxal or Zyrtec daily for the next 2 weeks.   You may also take over the counter Mucinex daily x 1 week.   Use your albuterol inhaler for wheezing, cough and chest congestion while you are sick.   Follow up if you are not back to baseline when you complete the antibiotic.   Take a multivitamin daily.   Cough, Adult Coughing is a reflex that clears your throat and your airways (respiratory system). Coughing helps to heal and protect your lungs. It is normal to cough occasionally, but a cough that happens with other symptoms or lasts a long time may be a sign of a condition that needs treatment. An acute cough may only last 2-3 weeks, while a chronic cough may last 8 or more weeks. Coughing is commonly caused by: Infection of the respiratory systemby viruses or bacteria. Breathing in substances that irritate your lungs. Allergies. Asthma. Mucus that runs down the back of your throat (postnasal drip). Smoking. Acid backing up from the stomach into the esophagus (gastroesophageal reflux). Certain medicines. Chronic lung problems. Other medical conditions such as heart failure or a blood clot in the lung (pulmonary embolism). Follow these instructions at home: Medicines Take over-the-counter and prescription medicines only as told by your health care provider. Talk with your health care provider before you take a cough suppressant medicine. Lifestyle Avoid cigarette smoke. Do not use any products that contain nicotine or tobacco, such as cigarettes, e-cigarettes, and chewing tobacco. If you need help quitting, ask your health care provider. Drink enough fluid to keep your urine pale yellow. Avoid caffeine. Do not drink alcohol if your health care provider tells you not to drink. General  instructions  Pay close attention to changes in your cough. Tell your health care provider about them. Always cover your mouth when you cough. Avoid things that make you cough, such as perfume, candles, cleaning products, or campfire or tobacco smoke. If the air is dry, use a cool mist vaporizer or humidifier in your bedroom or your home to help loosen secretions. If your cough is worse at night, try to sleep in a semi-upright position. Rest as needed. Keep all follow-up visits as told by your health care provider. This is important. Contact a health care provider if you: Have new symptoms. Cough up pus. Have a cough that does not get better after 2-3 weeks or gets worse. Cannot control your cough with cough suppressant medicines and you are losing sleep. Have pain that gets worse or pain that is not helped with medicine. Have a fever. Have unexplained weight loss. Have night sweats. Get help right away if: You cough up blood. You have difficulty breathing. Your heartbeat is very fast. These symptoms may represent a serious problem that is an emergency. Do not wait to see if the symptoms will go away. Get medical help right away. Call your local emergency services (911 in the [place] Do not drive yourself to the hospital. Summary Coughing is a reflex that clears your throat and your airways. It is normal to cough occasionally, but a cough that happens with other symptoms or lasts a long time may be a sign of a condition that needs treatment. Take over-the-counter and prescription medicines only as told by your health care provider. Always  cover your mouth when you cough. Contact a health care provider if you have new symptoms or a cough that does not get better after 2-3 weeks or gets worse. This information is not intended to replace advice given to you by your health care provider. Make sure you discuss any questions you have with your health care provider. Document Revised: 10/27/2021  Document Reviewed: 06/12/2018 Elsevier Patient Education  2023 [employer].

## 2022-05-12 NOTE — Progress Notes (Signed)
   Subjective:    Patient ID: Juan Hodge, male    DOB: [birth year], 46 y.o.   MRN: [id number]  HPI Chief Complaint  Patient presents with   Ear Pain    Reoccuring ear infection for about 3 weeks, has went through 2 rounds of abx. Amoxicillin 1st time and Ceftin the 2nd   He is here with c/o of cough x several weeks and right ear pain that started 3 weeks ago. Took a course of Amoxicillin and then Ceftin. No significant improvement.   C/o right ear feeling clogged and pressure He also had fever which resolved. nasal congestion   He used an ear irrigation kit and did not get anything out.   Denies chills, dizziness, chest pain, palpitations, shortness of breath, abdominal pain, N/V/D,  LE edema.    Review of Systems        Objective:   Physical Exam Constitutional:      General: He is not in acute distress.    Appearance: He is ill-appearing.  HENT:     Right Ear: Ear canal normal.     Left Ear: Tympanic membrane and ear canal normal.     Nose: Congestion present.     Mouth/Throat:     Mouth: Mucous membranes are moist.     Pharynx: No posterior oropharyngeal erythema.  Eyes:     Conjunctiva/sclera: Conjunctivae normal.     Pupils: Pupils are equal, round, and reactive to light.  Cardiovascular:     Rate and Rhythm: Normal rate and regular rhythm.  Pulmonary:     Effort: Pulmonary effort is normal.     Breath sounds: Wheezing present.  Musculoskeletal:     Cervical back: Normal range of motion and neck supple. No tenderness.  Lymphadenopathy:     Cervical: No cervical adenopathy.  Skin:    General: Skin is warm and dry.  Neurological:     General: No focal deficit present.     Mental Status: He is alert and oriented to person, place, and time.  Psychiatric:        Mood and Affect: Mood normal.        Behavior: Behavior normal.        Thought Content: Thought content normal.    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 Depo-Medrol 80 mg IM injection given in office. Chest x-ray ordered. Augmentin prescribed.  Counseling on symptomatic treatment including Flonase, nondrowsy antihistamine and Mucinex.  Use albuterol inhaler as needed for wheezing or shortness of breath.

## 2022-05-20 ENCOUNTER — Ambulatory Visit: Payer: No Typology Code available for payment source | Admitting: Orthopedic Surgery

## 2022-06-15 ENCOUNTER — Encounter: Payer: No Typology Code available for payment source | Admitting: Family Medicine

## 2022-07-06 ENCOUNTER — Other Ambulatory Visit: Payer: Self-pay | Admitting: Family Medicine

## 2022-07-06 DIAGNOSIS — I10 Essential (primary) hypertension: Secondary | ICD-10-CM

## 2022-07-14 ENCOUNTER — Encounter: Payer: No Typology Code available for payment source | Admitting: Family Medicine

## 2022-08-24 ENCOUNTER — Telehealth: Payer: Self-pay | Admitting: Family Medicine

## 2022-08-24 DIAGNOSIS — I10 Essential (primary) hypertension: Secondary | ICD-10-CM

## 2022-08-24 MED ORDER — LOSARTAN POTASSIUM-HCTZ 50-12.5 MG PO TABS: 1.0000 | ORAL_TABLET | Freq: Every day | ORAL | 2 refills | Status: DC

## 2022-08-24 NOTE — Telephone Encounter (Signed)
Sent refill to [facility] 

## 2022-08-24 NOTE — Telephone Encounter (Signed)
Prescription Request  08/24/2022  LOV: 05/12/2022  What is the name of the medication or equipment? losartan-hydrochlorothiazide (HYZAAR) 50-12.5 MG tablet   Have you contacted your pharmacy to request a refill? Yes   Which pharmacy would you like this sent to?   [facility] on [address]  Patient notified that their request is being sent to the clinical staff for review and that they should receive a response within 2 business days.   Please advise at Mobile [phone] (mobile)   Patient is currently out of above medication and has been since 08/22/22   Patient would like all future medications sent to [facility] on [address]. He no longer wants to use the [facility] [address].

## 2022-10-30 ENCOUNTER — Other Ambulatory Visit: Payer: Self-pay | Admitting: Family Medicine

## 2022-10-30 DIAGNOSIS — I10 Essential (primary) hypertension: Secondary | ICD-10-CM

## 2023-04-20 ENCOUNTER — Telehealth: Payer: Self-pay | Admitting: Family Medicine

## 2023-04-20 DIAGNOSIS — I10 Essential (primary) hypertension: Secondary | ICD-10-CM

## 2023-04-20 MED ORDER — LOSARTAN POTASSIUM-HCTZ 50-12.5 MG PO TABS: 1.0000 | ORAL_TABLET | Freq: Every day | ORAL | 0 refills | Status: DC

## 2023-04-20 NOTE — Telephone Encounter (Signed)
Patient said he is leaving town tomorrow and would like to know if this can be refilled ASAP.   Prescription Request  04/20/2023  LOV: 05/12/2022  What is the name of the medication or equipment?  losartan-hydrochlorothiazide (HYZAAR) 50-12.5 MG tablet  Have you contacted your pharmacy to request a refill? Yes   Which pharmacy would you like this sent to?  [id number] - [place], [place] - [address] AT [facility] [address] [place] [address] Phone: [phone] Fax: [phone]    Patient notified that their request is being sent to the clinical staff for review and that they should receive a response within 2 business days.   Please advise at Mobile [phone] (mobile)

## 2023-04-20 NOTE — Telephone Encounter (Signed)
Short supply of rx sent, pt needs ov

## 2023-05-12 ENCOUNTER — Other Ambulatory Visit: Payer: Self-pay | Admitting: Family Medicine

## 2023-05-12 DIAGNOSIS — I10 Essential (primary) hypertension: Secondary | ICD-10-CM

## 2023-06-02 ENCOUNTER — Telehealth: Payer: Self-pay

## 2023-06-02 DIAGNOSIS — I10 Essential (primary) hypertension: Secondary | ICD-10-CM

## 2023-06-02 NOTE — Telephone Encounter (Signed)
Copied from CRM [id number]. Topic: Clinical - Medication Refill >> Jun 02, 2023 11:56 AM Lennart Pall wrote: Reason for CRM: PT is unable to get refills for his blood pressure medication, notes say that he needs an appt before it can be refilled and he does not have insurance to pay for an office visit.

## 2023-06-03 ENCOUNTER — Telehealth: Payer: Self-pay | Admitting: Family Medicine

## 2023-06-03 DIAGNOSIS — I10 Essential (primary) hypertension: Secondary | ICD-10-CM

## 2023-06-03 MED ORDER — LOSARTAN POTASSIUM-HCTZ 50-12.5 MG PO TABS: 1.0000 | ORAL_TABLET | Freq: Every day | ORAL | 0 refills | Status: DC

## 2023-06-03 NOTE — Telephone Encounter (Signed)
Medication has already been sent. NO MORE REFILLS ALLOWED UNLESS APPOINTMENT IS MADE.

## 2023-06-03 NOTE — Telephone Encounter (Signed)
30 days sent. No other refills will be authorized unless he makes an appointment. It has been over a year.

## 2023-06-03 NOTE — Telephone Encounter (Signed)
Copied from CRM [id number]. Topic: Clinical - Medication Refill >> Jun 03, 2023 11:05 AM Joanette Gula wrote: Most Recent Primary Care Visit:  Provider: Avanell Shackleton  Department: [facility]  Visit Type: PHYSICAL  Date: 05/12/2022  Medication: losartan-hydrochlorothiazide (HYZAAR) 50-12.5 MG tablet    PATIENT STATES THAT HE IS NOW 2 DAYS WITHOUT HIS BP MEDICATION.Marland Kitchen AND CAN'T WAIT TIL HIS NEXT APPOINTMENT Has the patient contacted their pharmacy? Yes (Agent: If no, request that the patient contact the pharmacy for the refill. If patient does not wish to contact the pharmacy document the reason why and proceed with request.) (Agent: If yes, when and what did the pharmacy advise?)  Is this the correct pharmacy for this prescription? Yes If no, delete pharmacy and type the correct one.  This is the patient's preferred pharmacy:   [id number] - [place], [place] - [address] AT [facility] [address] [place] [address] Phone: [phone] Fax: [phone]   Has the prescription been filled recently? Yes  Is the patient out of the medication? Yes  Has the patient been seen for an appointment in the last year OR does the patient have an upcoming appointment? Yes  Can we respond through MyChart? No  Agent: Please be advised that Rx refills may take up to 3 business days. We ask that you follow-up with your pharmacy.

## 2023-07-02 ENCOUNTER — Other Ambulatory Visit: Payer: Self-pay | Admitting: Family Medicine

## 2023-07-02 DIAGNOSIS — I10 Essential (primary) hypertension: Secondary | ICD-10-CM

## 2023-07-14 ENCOUNTER — Ambulatory Visit: Payer: Self-pay | Admitting: Family Medicine

## 2023-07-14 ENCOUNTER — Encounter: Payer: Self-pay | Admitting: Family Medicine

## 2023-07-14 VITALS — BP 120/84 | HR 80 | Temp 97.6°F | Ht 70.0 in | Wt 213.0 lb

## 2023-07-14 DIAGNOSIS — R82998 Other abnormal findings in urine: Secondary | ICD-10-CM

## 2023-07-14 DIAGNOSIS — F411 Generalized anxiety disorder: Secondary | ICD-10-CM

## 2023-07-14 DIAGNOSIS — R300 Dysuria: Secondary | ICD-10-CM

## 2023-07-14 DIAGNOSIS — R350 Frequency of micturition: Secondary | ICD-10-CM

## 2023-07-14 DIAGNOSIS — E781 Pure hyperglyceridemia: Secondary | ICD-10-CM

## 2023-07-14 DIAGNOSIS — I10 Essential (primary) hypertension: Secondary | ICD-10-CM

## 2023-07-14 LAB — COMPREHENSIVE METABOLIC PANEL
ALT: 29 U/L (ref 0–53)
AST: 23 U/L (ref 0–37)
Albumin: 4.5 g/dL (ref 3.5–5.2)
Alkaline Phosphatase: 53 U/L (ref 39–117)
BUN: 14 mg/dL (ref 6–23)
CO2: 30 meq/L (ref 19–32)
Calcium: 9.7 mg/dL (ref 8.4–10.5)
Chloride: 100 meq/L (ref 96–112)
Creatinine, Ser: 0.87 mg/dL (ref 0.40–1.50)
GFR: 102.84 mL/min (ref >60.00)
Glucose, Bld: 87 mg/dL (ref 70–99)
Potassium: 3.9 meq/L (ref 3.5–5.1)
Sodium: 139 meq/L (ref 135–145)
Total Bilirubin: 0.9 mg/dL (ref 0.2–1.2)
Total Protein: 7.3 g/dL (ref 6.0–8.3)

## 2023-07-14 LAB — CBC
HCT: 47 % (ref 39.0–52.0)
Hemoglobin: 16.1 g/dL (ref 13.0–17.0)
MCHC: 34.3 g/dL (ref 30.0–36.0)
MCV: 85.7 fL (ref 78.0–100.0)
Platelets: 167 10*3/uL (ref 150.0–400.0)
RBC: 5.49 Mil/uL (ref 4.22–5.81)
RDW: 12.6 % (ref 11.5–15.5)
WBC: 6.4 10*3/uL (ref 4.0–10.5)

## 2023-07-14 LAB — LIPID PANEL
Cholesterol: 260 mg/dL — ABNORMAL HIGH (ref 0–200)
HDL: 34.4 mg/dL — ABNORMAL LOW (ref >39.00)
NonHDL: 225.85
Total CHOL/HDL Ratio: 8
Triglycerides: 521 mg/dL — ABNORMAL HIGH (ref 0.0–149.0)
VLDL: 104.2 mg/dL — ABNORMAL HIGH (ref 0.0–40.0)

## 2023-07-14 LAB — POC URINALSYSI DIPSTICK (AUTOMATED)
Bilirubin, UA: NEGATIVE
Blood, UA: NEGATIVE
Glucose, UA: NEGATIVE
Ketones, UA: NEGATIVE
Nitrite, UA: NEGATIVE
Protein, UA: NEGATIVE
Spec Grav, UA: 1.025 (ref 1.010–1.025)
Urobilinogen, UA: 0.2 U/dL
pH, UA: 7 (ref 5.0–8.0)

## 2023-07-14 LAB — PSA: PSA: 1.24 ng/mL (ref 0.10–4.00)

## 2023-07-14 LAB — LDL CHOLESTEROL, DIRECT: Direct LDL: 102 mg/dL

## 2023-07-14 MED ORDER — LOSARTAN POTASSIUM-HCTZ 50-12.5 MG PO TABS: 1.0000 | ORAL_TABLET | Freq: Every day | ORAL | 1 refills | Status: DC

## 2023-07-14 NOTE — Patient Instructions (Signed)
 Please go downstairs for labs before you leave today.  Your labs and urine test are all normal then I recommend a referral to urology.  Start taking over-the-counter Claritin or Xyzal daily for the next 4 weeks and see if this helps with your chronic nasal symptoms.  Continue your blood pressure medication daily.  We will be in touch with your results.

## 2023-07-14 NOTE — Progress Notes (Signed)
 Subjective:     Patient ID: Juan Hodge, male    DOB: 12/24/1975, 48 y.o.   MRN: [id number]  Chief Complaint  Patient presents with   Medical Management of Chronic Issues    HPI  History of Present Illness         Here to follow up on chronic health conditions including HTN and HLD.   He has been taking losartan - hydrochlorothiazide daily.    C/o chronic sinus issues. Takes Zyrtec  prn.   States hx of asthma and uses albuterol  rarely.   Anxiety- takes alprazolam  occasionally. Is not in counseling.   Drinks 2-4 alcoholic beverages per week   Dysuria every couple of months. Currently having urinary frequency and dysuria. No urgency or drainage. No rash or genital lesion.   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   Self employed and does not have health insurance      07/14/2023   10:05 AM 05/03/2022    2:56 PM 12/02/2021   10:13 AM 03/23/2016    2:47 PM  Depression screen PHQ 2/9  Decreased Interest 0 0 0 0  Down, Depressed, Hopeless 0 0 0 0  PHQ - 2 Score 0 0 0 0  Altered sleeping    3  Tired, decreased energy    3  Change in appetite    0  Feeling bad or failure about yourself     0  Trouble concentrating    1  Moving slowly or fidgety/restless    3  Suicidal thoughts    0  PHQ-9 Score    10      Health Maintenance Due  Topic Date Due   Pneumococcal Vaccine 11-74 Years old (1 of 2 - PCV) Never done   Hepatitis C Screening  Never done   DTaP/Tdap/Td (1 - Tdap) Never done    Past Medical History:  Diagnosis Date   Alcohol abuse, daily use    Allergy    Anxiety    Asthma    GERD (gastroesophageal reflux disease)    Hypertension    Hypertriglyceridemia 12/02/2021   Panic attacks    Substance abuse (HCC)     Past Surgical History:  Procedure Laterality Date   tubes in ears     as a child    Family History  Problem Relation Age of Onset   Heart disease Father 79       died of cardiac arrest 50   Heart attack Father    High  Cholesterol Father    Colon cancer Neg Hx    Colon polyps Neg Hx    Crohn's disease Neg Hx    Esophageal cancer Neg Hx    Rectal cancer Neg Hx    Stomach cancer Neg Hx    Ulcerative colitis Neg Hx     Social History   Socioeconomic History   Marital status: Single    Spouse name: Not on file   Number of children: 0   Years of education: Not on file   Highest education level: Not on file  Occupational History   Occupation: sales  Tobacco Use   Smoking status: Never    Passive exposure: Never   Smokeless tobacco: Never  Vaping Use   Vaping status: Never Used  Substance and Sexual Activity   Alcohol use: Yes    Comment: 2-4 glasses of wine. (1/2-1 bottle a night) 1-2 per week   Drug use: Yes    Types: Benzodiazepines, Marijuana  Comment: former user of marijuana smoke daily and cocaine   Sexual activity: Not on file  Other Topics Concern   Not on file  Social History Narrative   Not on file   Social Drivers of Health   Financial Resource Strain: Not on file  Food Insecurity: Not on file  Transportation Needs: Not on file  Physical Activity: Not on file  Stress: Not on file  Social Connections: Not on file  Intimate Partner Violence: Not on file    Outpatient Medications Prior to Visit  Medication Sig Dispense Refill   losartan -hydrochlorothiazide (HYZAAR) 50-12.5 MG tablet Take 1 tablet by mouth daily. Needs office visit for further refills. No more courtesy refills allowed. 30 tablet 0   albuterol  (VENTOLIN  HFA) 108 (90 Base) MCG/ACT inhaler TAKE 2 PUFFS BY MOUTH EVERY 6 HOURS AS NEEDED FOR WHEEZE OR SHORTNESS OF BREATH (Patient not taking: Reported on 07/14/2023) 8.5 each 0   famotidine  (PEPCID ) 40 MG tablet TAKE 1 TABLET BY MOUTH EVERY DAY (Patient not taking: Reported on 07/14/2023) 90 tablet 1   amoxicillin -clavulanate (AUGMENTIN ) 875-125 MG tablet Take 1 tablet by mouth 2 (two) times daily. (Patient not taking: Reported on 07/14/2023) 20 tablet 0   No  facility-administered medications prior to visit.    Allergies  Allergen Reactions   Erythromycin  Other (See Comments)    Tears stomach up    Review of Systems  Constitutional:  Negative for chills and fever.  HENT:  Positive for congestion.   Respiratory:  Negative for shortness of breath.   Cardiovascular:  Negative for chest pain, palpitations and leg swelling.  Gastrointestinal:  Negative for abdominal pain, constipation, diarrhea, nausea and vomiting.  Genitourinary:  Positive for dysuria and frequency. Negative for flank pain, hematuria and urgency.  Musculoskeletal:  Positive for myalgias.  Neurological:  Negative for dizziness and focal weakness.  Psychiatric/Behavioral:  Negative for depression and suicidal ideas. The patient is nervous/anxious.        Objective:    Physical Exam Constitutional:      General: He is not in acute distress.    Appearance: He is not ill-appearing.  HENT:     Nose: Congestion present.  Eyes:     Extraocular Movements: Extraocular movements intact.     Conjunctiva/sclera: Conjunctivae normal.  Cardiovascular:     Rate and Rhythm: Normal rate and regular rhythm.  Pulmonary:     Effort: Pulmonary effort is normal.     Breath sounds: Normal breath sounds.  Musculoskeletal:     Cervical back: Normal range of motion and neck supple.     Right lower leg: No edema.     Left lower leg: No edema.  Skin:    General: Skin is warm and dry.  Neurological:     General: No focal deficit present.     Mental Status: He is alert and oriented to person, place, and time.     Motor: No weakness.     Coordination: Coordination normal.     Gait: Gait normal.     Deep Tendon Reflexes: Reflexes normal.  Psychiatric:        Mood and Affect: Mood normal.        Behavior: Behavior normal.        Thought Content: Thought content normal.      BP 120/84 (BP Location: Left Arm, Patient Position: Sitting, Cuff Size: Large)   Pulse 80   Temp 97.6 F  (36.4 C) (Temporal)   Ht 5' 10 (1.778 m)  Wt 213 lb (96.6 kg)   SpO2 96%   BMI 30.56 kg/m  Wt Readings from Last 3 Encounters:  07/14/23 213 lb (96.6 kg)  05/12/22 208 lb (94.3 kg)  05/03/22 213 lb 8 oz (96.8 kg)       Assessment & Plan:   Problem List Items Addressed This Visit     Anxiety disorder   Hypertriglyceridemia   Relevant Medications   losartan -hydrochlorothiazide (HYZAAR) 50-12.5 MG tablet   Other Relevant Orders   Lipid panel (Completed)   EKG 12-Lead (Completed)   Primary hypertension - Primary   Relevant Medications   losartan -hydrochlorothiazide (HYZAAR) 50-12.5 MG tablet   Other Relevant Orders   CBC (Completed)   Comprehensive metabolic panel (Completed)   EKG 12-Lead (Completed)   Other Visit Diagnoses       Dysuria       Relevant Orders   PSA (Completed)   GC/Chlamydia Probe Amp   HIV Antibody (routine testing w rflx)   RPR   POCT Urinalysis Dipstick (Automated) (Completed)   Urine Culture     Urinary frequency       Relevant Orders   PSA (Completed)   POCT Urinalysis Dipstick (Automated) (Completed)     Leukocytes in urine       Relevant Orders   Urine Culture       UA shows trace leukocytes, otherwise neg. Urine culture sent.  Screen for GC/CT, HIV, RPR  EKG shows NSR, rate 68, incomplete RBBB, unchanged   Blood pressure has been well-controlled.  Continue medication.  Check renal function. History of hypertriglyceridemia.  Check lipid panel.  He is aware that if his STD panel and urine culture are negative that I will refer him to urology for further evaluation.  I am also checking PSA today.     I have discontinued Reyes HERO. Intriago Jeff's amoxicillin -clavulanate. I have also changed his losartan -hydrochlorothiazide. Additionally, I am having him maintain his famotidine  and albuterol .  Meds ordered this encounter  Medications   losartan -hydrochlorothiazide (HYZAAR) 50-12.5 MG tablet    Sig: Take 1 tablet by mouth  daily.    Dispense:  90 tablet    Refill:  1

## 2023-07-15 ENCOUNTER — Encounter: Payer: Self-pay | Admitting: Family Medicine

## 2023-07-15 LAB — HIV ANTIBODY (ROUTINE TESTING W REFLEX): HIV 1&2 Ab, 4th Generation: NONREACTIVE

## 2023-07-15 LAB — RPR: RPR Ser Ql: NONREACTIVE

## 2023-07-15 LAB — URINE CULTURE: Result:: NO GROWTH

## 2023-07-15 NOTE — Progress Notes (Signed)
 Juan Hodge,  Your triglycerides are greater than 500 again. Please come in next week fasting for 8 hours and hydrated to recheck.  Mounir Skipper

## 2023-07-18 LAB — GC/CHLAMYDIA PROBE AMP
Chlamydia trachomatis, NAA: NEGATIVE
Neisseria Gonorrhoeae by PCR: NEGATIVE

## 2023-07-20 ENCOUNTER — Encounter: Payer: Self-pay | Admitting: Family Medicine

## 2023-07-20 ENCOUNTER — Ambulatory Visit (INDEPENDENT_AMBULATORY_CARE_PROVIDER_SITE_OTHER): Payer: Self-pay | Admitting: Family Medicine

## 2023-07-20 VITALS — BP 132/80 | HR 70 | Temp 97.6°F | Ht 70.0 in

## 2023-07-20 DIAGNOSIS — E781 Pure hyperglyceridemia: Secondary | ICD-10-CM

## 2023-07-20 LAB — LIPID PANEL
Cholesterol: 200 mg/dL (ref 0–200)
HDL: 35.6 mg/dL — ABNORMAL LOW (ref >39.00)
LDL Cholesterol: 122 mg/dL — ABNORMAL HIGH (ref 0–99)
NonHDL: 164.26
Total CHOL/HDL Ratio: 6
Triglycerides: 210 mg/dL — ABNORMAL HIGH (ref 0.0–149.0)
VLDL: 42 mg/dL — ABNORMAL HIGH (ref 0.0–40.0)

## 2023-07-20 NOTE — Patient Instructions (Addendum)
Please go downstairs for labs.   Work on [employer]. Cut back on foods high in saturated fat, fried foods and animal products.   Increase your exercise.   I would like to see you back in 2-3 months.

## 2023-07-20 NOTE — Progress Notes (Signed)
Subjective:     Patient ID: Juan Hodge, male    DOB: 12/16/1975, 48 y.o.   MRN: [id number]  Chief Complaint  Patient presents with   Hyperlipidemia    Fasting to recheck lipids    Hyperlipidemia Pertinent negatives include no chest pain or shortness of breath.     History of Present Illness         He is here to discuss recently elevated triglycerides >500.  States he was not fasting at his appt   States his diet is poor. He is drinking more alcohol and does not exercise.      Health Maintenance Due  Topic Date Due   Pneumococcal Vaccine 43-63 Years old (1 of 2 - PCV) Never done   Hepatitis C Screening  Never done   DTaP/Tdap/Td (1 - Tdap) Never done    Past Medical History:  Diagnosis Date   Alcohol abuse, daily use    Allergy    Anxiety    Asthma    GERD (gastroesophageal reflux disease)    Hypertension    Hypertriglyceridemia 12/02/2021   Panic attacks    Substance abuse (HCC)     Past Surgical History:  Procedure Laterality Date   tubes in ears     as a child    Family History  Problem Relation Age of Onset   Heart disease Father 21       died of cardiac arrest 22   Heart attack Father    High Cholesterol Father    Colon cancer Neg Hx    Colon polyps Neg Hx    Crohn's disease Neg Hx    Esophageal cancer Neg Hx    Rectal cancer Neg Hx    Stomach cancer Neg Hx    Ulcerative colitis Neg Hx     Social History   Socioeconomic History   Marital status: Single    Spouse name: Not on file   Number of children: 0   Years of education: Not on file   Highest education level: Not on file  Occupational History   Occupation: sales  Tobacco Use   Smoking status: Never    Passive exposure: Never   Smokeless tobacco: Never  Vaping Use   Vaping status: Never Used  Substance and Sexual Activity   Alcohol use: Yes    Comment: 2-4 glasses of wine. (1/2-1 bottle a night) 1-2 per week   Drug use: Yes    Types: Benzodiazepines, Marijuana     Comment: former user of marijuana smoke daily and cocaine   Sexual activity: Not on file  Other Topics Concern   Not on file  Social History Narrative   Not on file   Social Drivers of Health   Financial Resource Strain: Not on file  Food Insecurity: Not on file  Transportation Needs: Not on file  Physical Activity: Not on file  Stress: Not on file  Social Connections: Not on file  Intimate Partner Violence: Not on file    Outpatient Medications Prior to Visit  Medication Sig Dispense Refill   albuterol (VENTOLIN HFA) 108 (90 Base) MCG/ACT inhaler TAKE 2 PUFFS BY MOUTH EVERY 6 HOURS AS NEEDED FOR WHEEZE OR SHORTNESS OF BREATH 8.5 each 0   famotidine (PEPCID) 40 MG tablet TAKE 1 TABLET BY MOUTH EVERY DAY 90 tablet 1   losartan-hydrochlorothiazide (HYZAAR) 50-12.5 MG tablet Take 1 tablet by mouth daily. 90 tablet 1   No facility-administered medications prior to visit.  Allergies  Allergen Reactions   Erythromycin Other (See Comments)    Tears stomach up    Review of Systems  Constitutional:  Negative for chills and fever.  Respiratory:  Negative for shortness of breath.   Cardiovascular:  Negative for chest pain, palpitations and leg swelling.  Gastrointestinal:  Negative for abdominal pain, constipation, diarrhea, nausea and vomiting.  Neurological:  Negative for dizziness.       Objective:    Physical Exam Constitutional:      General: He is not in acute distress.    Appearance: He is not ill-appearing.  Eyes:     Extraocular Movements: Extraocular movements intact.     Conjunctiva/sclera: Conjunctivae normal.  Cardiovascular:     Rate and Rhythm: Normal rate.  Pulmonary:     Effort: Pulmonary effort is normal.  Musculoskeletal:     Cervical back: Normal range of motion and neck supple.  Skin:    General: Skin is warm and dry.  Neurological:     General: No focal deficit present.     Mental Status: He is alert and oriented to person, place, and time.   Psychiatric:        Mood and Affect: Mood normal.        Behavior: Behavior normal.        Thought Content: Thought content normal.      BP 132/80 (BP Location: Left Arm, Patient Position: Sitting)   Pulse 70   Temp 97.6 F (36.4 C) (Temporal)   Ht 5\' 10"  (1.778 m)   SpO2 97%   BMI 30.56 kg/m  Wt Readings from Last 3 Encounters:  07/14/23 213 lb (96.6 kg)  05/12/22 208 lb (94.3 kg)  05/03/22 213 lb 8 oz (96.8 kg)       Assessment & Plan:   Problem List Items Addressed This Visit     Hypertriglyceridemia - Primary   Relevant Orders   Lipid panel   He is here for follow-up due to triglycerides being greater than 500.  States he was not fasting and today he is.  Reports family history of heart disease.  He is asymptomatic.  We discussed improving his diet and exercise as well as cutting back on alcohol.  He will follow-up in 2 months when he has health insurance and we will consider further evaluation.  I am having Farris Has. Alberteen Sam" maintain his famotidine, albuterol, and losartan-hydrochlorothiazide.  No orders of the defined types were placed in this encounter.

## 2024-01-27 ENCOUNTER — Other Ambulatory Visit: Payer: Self-pay | Admitting: Family Medicine

## 2024-01-27 DIAGNOSIS — I10 Essential (primary) hypertension: Secondary | ICD-10-CM

## 2024-02-20 ENCOUNTER — Ambulatory Visit: Payer: Self-pay

## 2024-02-20 ENCOUNTER — Other Ambulatory Visit: Payer: Self-pay | Admitting: Family Medicine

## 2024-02-20 DIAGNOSIS — I10 Essential (primary) hypertension: Secondary | ICD-10-CM

## 2024-02-20 NOTE — Telephone Encounter (Signed)
  FYI Only or Action Required?: FYI only for provider.  Patient was last seen in primary care on 07/20/2023 by Lendia Boby CROME, NP-C.  Called Nurse Triage reporting Arm Pain.  Symptoms began about a month ago.  Interventions attempted: Nothing.  Symptoms are: gradually worsening.  Triage Disposition: See PCP When Office is Open (Within 3 Days)  Patient/caregiver understands and will follow disposition?: Yes  Copied from CRM [id number]. Topic: Clinical - Red Word Triage >> Feb 20, 2024 12:06 PM Robinson H wrote: [facility] that prompted transfer to Nurse Triage: Pain left in arm that won't go away Reason for Disposition  [1] MODERATE pain (e.g., interferes with normal activities) AND [2] present > 3 days  Answer Assessment - Initial Assessment Questions 1. ONSET: When did the pain start?     About a month ago 2. LOCATION: Where is the pain located?    Left Wrist and forearm 3. PAIN: How bad is the pain? (Scale 0-10; or none, mild, moderate, severe)     Pain was off and on, but is now constant, dull annoying pain 4. WORK OR EXERCISE: Has there been any recent work or exercise that involved this part of the body?     Side sleeper and thinks that this may contribute 5. CAUSE: What do you think is causing the arm pain?     unsure 6. OTHER SYMPTOMS: Do you have any other symptoms? (e.g., neck pain, swelling, rash, fever, numbness, weakness)     denies 7. PREGNANCY: Is there any chance you are pregnant? When was your last menstrual period?     N/a  Protocols used: Arm Pain-A-AH

## 2024-02-20 NOTE — Telephone Encounter (Unsigned)
 Copied from CRM #[id number]. Topic: Clinical - Medication Refill >> Feb 20, 2024 12:09 PM Robinson H wrote: Medication: losartan -hydrochlorothiazide (HYZAAR) 50-12.5 MG tablet   Has the patient contacted their pharmacy? No, no refills (Agent: If no, request that the patient contact the pharmacy for the refill. If patient does not wish to contact the pharmacy document the reason why and proceed with request.) (Agent: If yes, when and what did the pharmacy advise?)   [id number] - [place], [place] - [address] AT [facility] [address] [place] [address] Phone: [phone] Fax: [phone]  Is this the correct pharmacy for this prescription? Yes If no, delete pharmacy and type the correct one.   Has the prescription been filled recently? No  Is the patient out of the medication? Yes  Has the patient been seen for an appointment in the last year OR does the patient have an upcoming appointment? Yes  Can we respond through MyChart? Yes  Agent: Please be advised that Rx refills may take up to 3 business days. We ask that you follow-up with your pharmacy.

## 2024-02-23 ENCOUNTER — Encounter: Payer: Self-pay | Admitting: Family Medicine

## 2024-02-23 ENCOUNTER — Ambulatory Visit: Payer: Self-pay | Admitting: Family Medicine

## 2024-02-23 VITALS — BP 132/86 | HR 80 | Temp 97.8°F | Ht 70.0 in | Wt 215.0 lb

## 2024-02-23 DIAGNOSIS — M79602 Pain in left arm: Secondary | ICD-10-CM | POA: Diagnosis not present

## 2024-02-23 DIAGNOSIS — E781 Pure hyperglyceridemia: Secondary | ICD-10-CM | POA: Diagnosis not present

## 2024-02-23 DIAGNOSIS — I10 Essential (primary) hypertension: Secondary | ICD-10-CM

## 2024-02-23 DIAGNOSIS — F411 Generalized anxiety disorder: Secondary | ICD-10-CM

## 2024-02-23 NOTE — Progress Notes (Signed)
 Subjective:     Patient ID: Juan Hodge, male    DOB: March 13, 1976, 48 y.o.   MRN: [id number]  Chief Complaint  Patient presents with   Wrist Pain    Left wrist and forearm pain for the last month. Has tried ibuprofen  which did seem to help. Unknown cause     Wrist Pain  Pertinent negatives include no fever or tingling.    Discussed the use of AI scribe software for clinical note transcription with the patient, who gave verbal consent to proceed.  History of Present Illness Juan Hodge is a 48 year old male with hypertension who presents with left wrist and forearm pain.  Left wrist and forearm pain - Dull pain localized to the left wrist and forearm for the past month, currently absent - Pain does not radiate - Pain sometimes alleviated by massaging the wrist - Ibuprofen  and Xanax  provide relief - Pain associated with anxiety and stress  Hypertension - Has not taken antihypertensive medication for three days due to delay in refilling - Plans to resume medication soon - Concerned about potentially elevated cholesterol levels  Psychological stress and anxiety - Experiences significant stress related to financial and work issues, despite recent improvements - Adjusting to a new school schedule - Feels tired - Occasionally uses Xanax  for anxiety although this is not prescribed  - Has returned to therapy and is seeking effective anti-anxiety medication     Health Maintenance Due  Topic Date Due   Hepatitis C Screening  Never done   DTaP/Tdap/Td (1 - Tdap) Never done   Pneumococcal Vaccine (1 of 2 - PCV) Never done   Hepatitis B Vaccines 19-59 Average Risk (1 of 3 - 19+ 3-dose series) Never done    Past Medical History:  Diagnosis Date   Alcohol abuse, daily use    Allergy    Anxiety    Asthma    GERD (gastroesophageal reflux disease)    Hypertension    Hypertriglyceridemia 12/02/2021   Panic attacks    Substance abuse (HCC)     Past Surgical  History:  Procedure Laterality Date   tubes in ears     as a child    Family History  Problem Relation Age of Onset   Heart disease Father 73       died of cardiac arrest 45   Heart attack Father    High Cholesterol Father    Colon cancer Neg Hx    Colon polyps Neg Hx    Crohn's disease Neg Hx    Esophageal cancer Neg Hx    Rectal cancer Neg Hx    Stomach cancer Neg Hx    Ulcerative colitis Neg Hx     Social History   Socioeconomic History   Marital status: Single    Spouse name: Not on file   Number of children: 0   Years of education: Not on file   Highest education level: Not on file  Occupational History   Occupation: sales  Tobacco Use   Smoking status: Never    Passive exposure: Never   Smokeless tobacco: Never  Vaping Use   Vaping status: Never Used  Substance and Sexual Activity   Alcohol use: Yes    Comment: 2-4 glasses of wine. (1/2-1 bottle a night) 1-2 per week   Drug use: Yes    Types: Benzodiazepines, Marijuana    Comment: former user of marijuana smoke daily and cocaine   Sexual activity: Not on  file  Other Topics Concern   Not on file  Social History Narrative   Not on file   Social Drivers of Health   Financial Resource Strain: Not on file  Food Insecurity: Not on file  Transportation Needs: Not on file  Physical Activity: Not on file  Stress: Not on file  Social Connections: Not on file  Intimate Partner Violence: Not on file    Outpatient Medications Prior to Visit  Medication Sig Dispense Refill   albuterol  (VENTOLIN  HFA) 108 (90 Base) MCG/ACT inhaler TAKE 2 PUFFS BY MOUTH EVERY 6 HOURS AS NEEDED FOR WHEEZE OR SHORTNESS OF BREATH 8.5 each 0   losartan -hydrochlorothiazide (HYZAAR) 50-12.5 MG tablet Take 1 tablet by mouth daily. Please schedule follow up appointment. 90 tablet 0   famotidine  (PEPCID ) 40 MG tablet TAKE 1 TABLET BY MOUTH EVERY DAY 90 tablet 1   No facility-administered medications prior to visit.    Allergies   Allergen Reactions   Erythromycin  Other (See Comments)    Tears stomach up    Review of Systems  Constitutional:  Positive for malaise/fatigue. Negative for chills and fever.  Respiratory:  Negative for shortness of breath.   Cardiovascular:  Negative for chest pain, palpitations and leg swelling.  Gastrointestinal:  Negative for abdominal pain, constipation, diarrhea, nausea and vomiting.  Genitourinary:  Negative for dysuria, frequency and urgency.  Musculoskeletal:  Positive for myalgias.  Neurological:  Negative for dizziness, tingling and focal weakness.  Psychiatric/Behavioral:  The patient is nervous/anxious.        Objective:    Physical Exam Constitutional:      General: He is not in acute distress.    Appearance: He is not ill-appearing.  Eyes:     Extraocular Movements: Extraocular movements intact.     Conjunctiva/sclera: Conjunctivae normal.  Cardiovascular:     Rate and Rhythm: Normal rate.  Pulmonary:     Effort: Pulmonary effort is normal.  Musculoskeletal:        General: Normal range of motion.     Cervical back: Normal range of motion and neck supple.  Skin:    General: Skin is warm and dry.  Neurological:     General: No focal deficit present.     Mental Status: He is alert and oriented to person, place, and time.  Psychiatric:        Mood and Affect: Mood normal.        Behavior: Behavior normal.        Thought Content: Thought content normal.      BP 132/86   Pulse 80   Temp 97.8 F (36.6 C) (Temporal)   Ht 5' 10 (1.778 m)   Wt 215 lb (97.5 kg)   SpO2 98%   BMI 30.85 kg/m  Wt Readings from Last 3 Encounters:  02/23/24 215 lb (97.5 kg)  07/14/23 213 lb (96.6 kg)  05/12/22 208 lb (94.3 kg)       Assessment & Plan:   Problem List Items Addressed This Visit     Anxiety disorder   Hypertriglyceridemia   Primary hypertension   Other Visit Diagnoses       Left arm pain    -  Primary       Assessment and Plan Assessment &  Plan Left wrist and forearm pain Intermittent dull pain in the left wrist and forearm for the past month, possibly due to muscle soreness or arthritis. No acute injury reported. Symptoms may be related to recent changes in  physical activity and stress levels. - Use over-the-counter NSAIDs like ibuprofen  for pain relief as needed  Essential hypertension Hypertension with recent lapse in medication adherence due to not refilling prescription. Plans to resume medication. Importance of adherence discussed to manage blood pressure and reduce cardiovascular risk. - Resume losartan -hydrochlorothiazide (Hyzaar) 50-12.5 mg oral daily  Hyperlipidemia and hypertriglyceridemia Concerns about current cholesterol levels. No recent fasting lipid panel due to non-fasting state during visit. Discussed dietary modifications and regular monitoring to manage cholesterol levels and reduce cardiovascular risk. - Schedule a morning appointment for fasting lipid panel - Discuss dietary modifications to manage cholesterol levels  Generalized anxiety disorder Recent increase in stress despite resolution of financial stressors. Currently using Xanax  as needed for acute anxiety episodes. He is not prescribed Xanax . Seeking long-term medication management. Discussed need for consistent therapy and potential referral to a psychiatrist. - Provide list of offices for counseling and medication management - Continue therapy sessions - Consider referral to a psychiatrist for medication management     I have discontinued Blue Winther. Giorgio Jeff's famotidine . I am also having him maintain his albuterol  and losartan -hydrochlorothiazide.  No orders of the defined types were placed in this encounter.

## 2024-02-23 NOTE — Patient Instructions (Signed)
[email or web address]   [email or web address]   [facility] Multiple locations [phone]     [facility] [address] [place], [place] [address]  Phone: [phone]   [facility]  [address], [place], [place] [address]  Phone: [phone]

## 2024-03-06 ENCOUNTER — Ambulatory Visit: Admitting: Family Medicine

## 2024-03-14 ENCOUNTER — Ambulatory Visit: Admitting: Family Medicine

## 2024-03-14 ENCOUNTER — Encounter: Payer: Self-pay | Admitting: Family Medicine

## 2024-03-14 VITALS — BP 118/82 | HR 77 | Temp 97.8°F | Ht 70.0 in | Wt 210.0 lb

## 2024-03-14 DIAGNOSIS — R058 Other specified cough: Secondary | ICD-10-CM

## 2024-03-14 DIAGNOSIS — E66811 Obesity, class 1: Secondary | ICD-10-CM | POA: Diagnosis not present

## 2024-03-14 DIAGNOSIS — Z8249 Family history of ischemic heart disease and other diseases of the circulatory system: Secondary | ICD-10-CM

## 2024-03-14 DIAGNOSIS — H6991 Unspecified Eustachian tube disorder, right ear: Secondary | ICD-10-CM

## 2024-03-14 DIAGNOSIS — I10 Essential (primary) hypertension: Secondary | ICD-10-CM

## 2024-03-14 DIAGNOSIS — J309 Allergic rhinitis, unspecified: Secondary | ICD-10-CM

## 2024-03-14 DIAGNOSIS — E782 Mixed hyperlipidemia: Secondary | ICD-10-CM | POA: Diagnosis not present

## 2024-03-14 DIAGNOSIS — Z6830 Body mass index (BMI) 30.0-30.9, adult: Secondary | ICD-10-CM

## 2024-03-14 LAB — CBC WITH DIFFERENTIAL/PLATELET
Basophils Absolute: 0 K/uL (ref 0.0–0.1)
Basophils Relative: 0.8 % (ref 0.0–3.0)
Eosinophils Absolute: 0.2 K/uL (ref 0.0–0.7)
Eosinophils Relative: 3.9 % (ref 0.0–5.0)
HCT: 45.5 % (ref 39.0–52.0)
Hemoglobin: 15.4 g/dL (ref 13.0–17.0)
Lymphocytes Relative: 32.1 % (ref 12.0–46.0)
Lymphs Abs: 1.9 K/uL (ref 0.7–4.0)
MCHC: 33.8 g/dL (ref 30.0–36.0)
MCV: 84.7 fl (ref 78.0–100.0)
Monocytes Absolute: 0.5 K/uL (ref 0.1–1.0)
Monocytes Relative: 9.3 % (ref 3.0–12.0)
Neutro Abs: 3.2 K/uL (ref 1.4–7.7)
Neutrophils Relative %: 53.9 % (ref 43.0–77.0)
Platelets: 159 K/uL (ref 150.0–400.0)
RBC: 5.38 Mil/uL (ref 4.22–5.81)
RDW: 12.9 % (ref 11.5–15.5)
WBC: 5.9 K/uL (ref 4.0–10.5)

## 2024-03-14 LAB — COMPREHENSIVE METABOLIC PANEL WITH GFR
ALT: 22 U/L (ref 0–53)
AST: 22 U/L (ref 0–37)
Albumin: 4.5 g/dL (ref 3.5–5.2)
Alkaline Phosphatase: 46 U/L (ref 39–117)
BUN: 14 mg/dL (ref 6–23)
CO2: 29 meq/L (ref 19–32)
Calcium: 9.2 mg/dL (ref 8.4–10.5)
Chloride: 104 meq/L (ref 96–112)
Creatinine, Ser: 0.89 mg/dL (ref 0.40–1.50)
GFR: 101.66 mL/min (ref >60.00)
Glucose, Bld: 96 mg/dL (ref 70–99)
Potassium: 4 meq/L (ref 3.5–5.1)
Sodium: 140 meq/L (ref 135–145)
Total Bilirubin: 1 mg/dL (ref 0.2–1.2)
Total Protein: 7.4 g/dL (ref 6.0–8.3)

## 2024-03-14 LAB — LIPID PANEL
Cholesterol: 199 mg/dL (ref 0–200)
HDL: 36.7 mg/dL — ABNORMAL LOW (ref >39.00)
LDL Cholesterol: 124 mg/dL — ABNORMAL HIGH (ref 0–99)
NonHDL: 162.64
Total CHOL/HDL Ratio: 5
Triglycerides: 192 mg/dL — ABNORMAL HIGH (ref 0.0–149.0)
VLDL: 38.4 mg/dL (ref 0.0–40.0)

## 2024-03-14 LAB — HEMOGLOBIN A1C: Hgb A1c MFr Bld: 5.3 % (ref 4.6–6.5)

## 2024-03-14 LAB — TSH: TSH: 1.73 u[IU]/mL (ref 0.35–5.50)

## 2024-03-14 MED ORDER — AMOXICILLIN-POT CLAVULANATE 875-125 MG PO TABS: 1.0000 | ORAL_TABLET | Freq: Two times a day (BID) | ORAL | 0 refills | Status: AC

## 2024-03-14 NOTE — Progress Notes (Signed)
 Subjective:     Patient ID: Juan Hodge, male    DOB: July 08, 1975, 48 y.o.   MRN: [id number]  Chief Complaint  Patient presents with   Follow-up    Fasting to check lipids   Last 2 weeks, sinus issues such as right ear pain and headaches as well as fatigue (covid and flu test negative)    HPI  Discussed the use of AI scribe software for clinical note transcription with the patient, who gave verbal consent to proceed.  History of Present Illness Juan Hodge is a 48 year old male who presents with upper respiratory symptoms and ear congestion.  Upper respiratory symptoms - Congestion, rhinorrhea, headache, and cough present for nearly three weeks - Symptoms significantly impact work and daily activities - Over-the-counter medications used: Zyrtec, Mucinex, and Sudafed - Sudafed alleviates nasal congestion but not right ear congestion - Occasional expectoration of discolored phlegm - No wheezing - Some dyspnea and discomfort when drinking water - COVID-19 and influenza tests negative  Right ear fullness and congestion - Sensation of fullness in the right ear for nearly three weeks - No ear pain - Persistent right ear congestion despite use of Sudafed - Unable to expel mucus from nose  Allergic rhinitis and chronic nasal obstruction - Perennial allergies with seasonal exacerbations - Chronic nasal obstruction  History of ear infections - History of ear infections in both ears  Substance use - Reduced alcohol consumption to two to four drinks per week - Quit smoking twenty years ago - Abstained from marijuana use for the past three weeks due to illness     Health Maintenance Due  Topic Date Due   Hepatitis C Screening  Never done   DTaP/Tdap/Td (1 - Tdap) Never done   Pneumococcal Vaccine (1 of 2 - PCV) Never done   Hepatitis B Vaccines 19-59 Average Risk (1 of 3 - 19+ 3-dose series) Never done   COVID-19 Vaccine (2 - Pfizer risk series) 10/18/2019     Past Medical History:  Diagnosis Date   Alcohol abuse, daily use    Allergy    Anxiety    Asthma    GERD (gastroesophageal reflux disease)    Hypertension    Hypertriglyceridemia 12/02/2021   Panic attacks    Substance abuse (HCC)     Past Surgical History:  Procedure Laterality Date   tubes in ears     as a child    Family History  Problem Relation Age of Onset   Heart disease Father 6       died of cardiac arrest 5   Heart attack Father    High Cholesterol Father    Colon cancer Neg Hx    Colon polyps Neg Hx    Crohn's disease Neg Hx    Esophageal cancer Neg Hx    Rectal cancer Neg Hx    Stomach cancer Neg Hx    Ulcerative colitis Neg Hx     Social History   Socioeconomic History   Marital status: Single    Spouse name: Not on file   Number of children: 0   Years of education: Not on file   Highest education level: Not on file  Occupational History   Occupation: sales  Tobacco Use   Smoking status: Never    Passive exposure: Never   Smokeless tobacco: Never  Vaping Use   Vaping status: Never Used  Substance and Sexual Activity   Alcohol use: Yes  Comment: 2-4 glasses of wine. (1/2-1 bottle a night) 1-2 per week   Drug use: Yes    Types: Benzodiazepines, Marijuana    Comment: former user of marijuana smoke daily and cocaine   Sexual activity: Not on file  Other Topics Concern   Not on file  Social History Narrative   Not on file   Social Drivers of Health   Financial Resource Strain: Not on file  Food Insecurity: Not on file  Transportation Needs: Not on file  Physical Activity: Not on file  Stress: Not on file  Social Connections: Not on file  Intimate Partner Violence: Not on file    Outpatient Medications Prior to Visit  Medication Sig Dispense Refill   albuterol  (VENTOLIN  HFA) 108 (90 Base) MCG/ACT inhaler TAKE 2 PUFFS BY MOUTH EVERY 6 HOURS AS NEEDED FOR WHEEZE OR SHORTNESS OF BREATH 8.5 each 0   losartan -hydrochlorothiazide  (HYZAAR) 50-12.5 MG tablet Take 1 tablet by mouth daily. Please schedule follow up appointment. 90 tablet 0   No facility-administered medications prior to visit.    Allergies  Allergen Reactions   Erythromycin  Other (See Comments)    Tears stomach up    Review of Systems  Constitutional:  Positive for malaise/fatigue. Negative for chills and fever.  HENT:  Positive for congestion and hearing loss.   Respiratory:  Positive for cough and sputum production. Negative for shortness of breath.   Cardiovascular:  Negative for chest pain, palpitations and leg swelling.  Gastrointestinal:  Negative for abdominal pain, constipation, diarrhea, nausea and vomiting.  Genitourinary:  Negative for dysuria, frequency and urgency.  Neurological:  Positive for headaches. Negative for dizziness and focal weakness.       Objective:    Physical Exam Constitutional:      General: He is not in acute distress.    Appearance: He is not ill-appearing.  HENT:     Right Ear: Decreased hearing noted. A middle ear effusion is present.     Left Ear: Tympanic membrane and ear canal normal.     Nose: Congestion present.     Right Turbinates: Enlarged.     Left Turbinates: Enlarged.     Right Sinus: No maxillary sinus tenderness or frontal sinus tenderness.     Left Sinus: No maxillary sinus tenderness or frontal sinus tenderness.     Mouth/Throat:     Mouth: Mucous membranes are moist.     Pharynx: Oropharynx is clear. Posterior oropharyngeal erythema present.     Tonsils: No tonsillar exudate.  Eyes:     Extraocular Movements: Extraocular movements intact.     Conjunctiva/sclera: Conjunctivae normal.     Pupils: Pupils are equal, round, and reactive to light.  Cardiovascular:     Rate and Rhythm: Normal rate and regular rhythm.  Pulmonary:     Effort: Pulmonary effort is normal.     Breath sounds: Normal breath sounds.  Musculoskeletal:     Cervical back: Normal range of motion and neck supple. No  tenderness.     Right lower leg: No edema.     Left lower leg: No edema.  Lymphadenopathy:     Cervical: No cervical adenopathy.  Skin:    General: Skin is warm and dry.  Neurological:     General: No focal deficit present.     Mental Status: He is alert and oriented to person, place, and time.     Motor: No weakness.     Coordination: Coordination normal.     Gait:  Gait normal.  Psychiatric:        Mood and Affect: Mood normal.        Behavior: Behavior normal.        Thought Content: Thought content normal.      BP 118/82   Pulse 77   Temp 97.8 F (36.6 C) (Temporal)   Ht 5' 10 (1.778 m)   Wt 210 lb (95.3 kg)   SpO2 95%   BMI 30.13 kg/m  Wt Readings from Last 3 Encounters:  03/14/24 210 lb (95.3 kg)  02/23/24 215 lb (97.5 kg)  07/14/23 213 lb (96.6 kg)       Assessment & Plan:   Problem List Items Addressed This Visit     Obesity (BMI 30.0-34.9)   Relevant Orders   Lipid panel   CBC with Differential/Platelet   Comprehensive metabolic panel with GFR   TSH   Hemoglobin A1c   Primary hypertension   Relevant Orders   CBC with Differential/Platelet   Comprehensive metabolic panel with GFR   TSH   Other Visit Diagnoses       Mixed hyperlipidemia    -  Primary   Relevant Orders   Lipid panel   Lipoprotein A (LPA)     Cough present for greater than 3 weeks       Relevant Orders   Ambulatory referral to Allergy     Eustachian tube dysfunction, right         Chronic allergic rhinitis       Relevant Orders   Ambulatory referral to Allergy     Family history of ischemic heart disease       Relevant Orders   Lipoprotein A (LPA)       Assessment and Plan Assessment & Plan Acute right ear effusion with upper respiratory symptoms and acute sinusitis Presents with a three-week history of upper respiratory symptoms, including congestion, headache, and cough. Reports right ear fullness and decreased hearing, with no pain but a sensation of being clogged.  Examination reveals fluid in the right ear and dullness of the tympanic membrane.  Symptoms are debilitating, affecting daily activities. - Prescribe Augmentin  to cover potential bacterial causes of ear and sinus infections. Instruct to take with food and plenty of water to avoid gastrointestinal upset. - Continue symptomatic treatment for upper respiratory symptoms.  Mixed hyperlipidemia Family history of heart disease, with father having had a heart attack in his mid to late 82. Expresses concern about past lifestyle choices, including alcohol use, but has since reduced alcohol consumption significantly. Fasting for blood tests to assess cholesterol levels and lipoprotein A, which will help determine cardiovascular risk and the need for further intervention. - Order fasting lipid panel and lipoprotein A test to assess cardiovascular risk. - Discuss potential need for cholesterol-lowering medication based on test results.  Allergic rhinitis (chronic) Reports chronic nasal congestion and difficulty breathing through his nose, with symptoms exacerbated by seasonal changes. Has not undergone allergy testing and expresses interest in exploring treatment options, including allergy shots. - Refer to an allergist for further evaluation and potential allergy testing.  Cough Persistent cough associated with upper respiratory symptoms. Reports coughing up phlegm, sometimes colored, over the past three weeks. No wheezing on examination, but the cough is part of the overall upper respiratory infection picture. - Continue symptomatic treatment for cough as part of the upper respiratory infection management.     I am having Reyes HERO. Delores Mott start on amoxicillin -clavulanate. I am also having him  maintain his albuterol  and losartan -hydrochlorothiazide.  Meds ordered this encounter  Medications   amoxicillin -clavulanate (AUGMENTIN ) 875-125 MG tablet    Sig: Take 1 tablet by mouth 2 (two) times  daily.    Dispense:  20 tablet    Refill:  0    Supervising Provider:   ROLLENE NORRIS A [[id number]]

## 2024-03-18 LAB — LIPOPROTEIN A (LPA): Lipoprotein (a): <10 nmol/L (ref <75)

## 2024-03-19 ENCOUNTER — Ambulatory Visit: Payer: Self-pay | Admitting: Family Medicine

## 2024-04-11 ENCOUNTER — Other Ambulatory Visit: Payer: Self-pay

## 2024-04-11 ENCOUNTER — Encounter: Payer: Self-pay | Admitting: Internal Medicine

## 2024-04-11 ENCOUNTER — Ambulatory Visit: Payer: Self-pay | Admitting: Internal Medicine

## 2024-04-11 VITALS — BP 130/88 | HR 74 | Temp 98.2°F | Ht 69.29 in | Wt 214.7 lb

## 2024-04-11 DIAGNOSIS — J3089 Other allergic rhinitis: Secondary | ICD-10-CM

## 2024-04-11 DIAGNOSIS — J4520 Mild intermittent asthma, uncomplicated: Secondary | ICD-10-CM | POA: Diagnosis not present

## 2024-04-11 MED ORDER — FLUTICASONE PROPIONATE 50 MCG/ACT NA SUSP: 2.0000 | Freq: Every day | NASAL | 5 refills | Status: AC

## 2024-04-11 MED ORDER — CETIRIZINE HCL 10 MG PO TABS: 10.0000 mg | ORAL_TABLET | Freq: Every day | ORAL | 5 refills | Status: AC

## 2024-04-11 MED ORDER — ALBUTEROL SULFATE HFA 108 (90 BASE) MCG/ACT IN AERS: 2.0000 | INHALATION_SPRAY | Freq: Four times a day (QID) | RESPIRATORY_TRACT | 1 refills | Status: AC | PRN

## 2024-04-11 NOTE — Patient Instructions (Addendum)
 Other Allergic Rhinitis:   - Use nasal saline rinses before nose sprays such as with Neilmed Sinus Rinse.  Use distilled water.   - Use Flonase 2 sprays each nostril daily. Aim upward and outward. - Use Zyrtec 10 mg daily.    Mild Intermittent Asthma: - Rescue inhaler: Albuterol  2 puffs every 4-6 hours as needed for respiratory symptoms of cough, shortness of breath, or wheezing Asthma control goals:  Full participation in all desired activities (may need albuterol  before activity) Albuterol  use two times or less a week on average (not counting use with activity) Cough interfering with sleep two times or less a month Oral steroids no more than once a year No hospitalizations   Hold all anti-histamines (Xyzal, Allegra, Zyrtec, Claritin, Benadryl, Pepcid ) 3 days prior to next visit.  Follow up: 9 on 11/12 for skin testing 1-55

## 2024-04-11 NOTE — Progress Notes (Signed)
 NEW PATIENT  Date of Service/Encounter:  04/11/24  Consult requested by: Lendia Boby CROME, NP-C   Subjective:   Juan Hodge (DOB: Jan 22, 1976) is a 48 y.o. male who presents to the clinic on 04/11/2024 with a chief complaint of Allergic Rhinitis , Nasal Congestion, Asthma, and Establish Care .    History obtained from: chart review and patient.   Asthma:  Diagnosed around age 64-12.   He was told it is mostly exercise induced.  Notes it is doing okay but worsens with exposure to allergens or illness; he does not run a lot anymore.   Using rescue inhaler: about twice a year.  Limitations to daily activity: none 0 ED visits/UC visits and 0 oral steroids in the past year 0 number of lifetime hospitalizations, 0 number of lifetime intubations.  Identified Triggers: respiratory illness and allergies Prior PFTs or spirometry: none Previously used therapies: none Current regimen:  Maintenance: none Rescue: Albuterol  2 puffs q4-6 hrs PRN  Rhinitis:  Started since childhood.  Symptoms include: nasal congestion, rhinorrhea, post nasal drainage, and sneezing, clogged ears  Occurs seasonally-Spring/Fall/Winter Potential triggers: not sure  Treatments tried:  Zyrtec   Previous allergy testing: yes; positive in childhood- dust mites History of sinus surgery: no Nonallergic triggers: none    Reviewed:  03/14/2024: seen by Lendia PA for HTN, HLD, ET dysfunction, chronic cough.  Rx augmentin  for ear effusion and refer to Allergy for rhinitis.    10/03/2023: seen by ortho for angioleiomyoma s/p excision, benign.   05/12/2022: seen by PCP for cough, wheezing, ET dysfunction.  Exam with wheezing.  Given Depo and Augmentin .   Past Medical History: Past Medical History:  Diagnosis Date   Alcohol abuse, daily use    Allergy    Anxiety    Asthma    GERD (gastroesophageal reflux disease)    Hypertension    Hypertriglyceridemia 12/02/2021   Panic attacks    Substance abuse (HCC)     Past Surgical History: Past Surgical History:  Procedure Laterality Date   tubes in ears     as a child   TYMPANOSTOMY TUBE PLACEMENT      Family History: Family History  Problem Relation Age of Onset   Allergic rhinitis Father    Heart disease Father 75       died of cardiac arrest 59   Heart attack Father    High Cholesterol Father    Colon cancer Neg Hx    Colon polyps Neg Hx    Crohn's disease Neg Hx    Esophageal cancer Neg Hx    Rectal cancer Neg Hx    Stomach cancer Neg Hx    Ulcerative colitis Neg Hx     Social History:  Flooring in bedroom: engineer, civil (consulting) Pets: dog Tobacco use/exposure: none Job: office job   Medication List:  Allergies as of 04/11/2024       Reactions   Erythromycin  Other (See Comments)   Tears stomach up        Medication List        Accurate as of April 11, 2024 10:26 AM. If you have any questions, ask your nurse or doctor.          STOP taking these medications    cetirizine 10 MG chewable tablet Commonly known as: ZYRTEC Replaced by: cetirizine 10 MG tablet Stopped by: Arleta SHAUNNA Blanch       TAKE these medications    albuterol  108 (90 Base) MCG/ACT inhaler Commonly known as:  VENTOLIN  HFA Inhale 2 puffs into the lungs every 6 (six) hours as needed for shortness of breath or wheezing. What changed: See the new instructions. Changed by: Arleta SHAUNNA Blanch   amoxicillin -clavulanate 875-125 MG tablet Commonly known as: AUGMENTIN  Take 1 tablet by mouth 2 (two) times daily.   cetirizine 10 MG tablet Commonly known as: ZyrTEC Allergy Take 1 tablet (10 mg total) by mouth daily. Replaces: cetirizine 10 MG chewable tablet Started by: Arleta SHAUNNA Blanch   fluticasone 50 MCG/ACT nasal spray Commonly known as: FLONASE Place 2 sprays into both nostrils daily. Started by: Arleta SHAUNNA Blanch   losartan -hydrochlorothiazide 50-12.5 MG tablet Commonly known as: HYZAAR Take 1 tablet by mouth daily. Please schedule follow up appointment.          REVIEW OF SYSTEMS: Pertinent positives and negatives discussed in HPI.   Objective:   Physical Exam: BP 130/88 (BP Location: Left Arm, Patient Position: Sitting, Cuff Size: Normal)   Pulse 74   Temp 98.2 F (36.8 C) (Temporal)   Ht 5' 9.29 (1.76 m)   Wt 214 lb 11.2 oz (97.4 kg)   SpO2 95%   BMI 31.44 kg/m  Body mass index is 31.44 kg/m. GEN: alert, well developed HEENT: clear conjunctiva, nose with + mild inferior turbinate hypertrophy, pink nasal mucosa, + clear rhinorrhea, + cobblestoning, TM slightly hazy bl but no erythema or bulging HEART: regular rate and rhythm, no murmur LUNGS: clear to auscultation bilaterally, no coughing, unlabored respiration ABDOMEN: soft, non distended  SKIN: no rashes or lesions  Spirometry:  Tracings reviewed. His effort: Good reproducible efforts. FVC: 4.14L, 85% predicted FEV1: 3.31L, 86% predicted FEV1/FVC ratio: 80% Interpretation: Spirometry consistent with normal pattern.  Please see scanned spirometry results for details.   Assessment:   1. Other allergic rhinitis   2. Mild intermittent asthma without complication     Plan/Recommendations:  Other Allergic Rhinitis: - Due to turbinate hypertrophy, seasonal symptoms, asthma and unresponsive to over the counter meds, will perform skin testing to identify aeroallergen triggers.   - Use nasal saline rinses before nose sprays such as with Neilmed Sinus Rinse.  Use distilled water.   - Use Flonase 2 sprays each nostril daily. Aim upward and outward. - Use Zyrtec 10 mg daily.    Mild Intermittent Asthma: - MDI technique discussed.  Spirometry today was normal. Well controlled.  - Rescue inhaler: Albuterol  2 puffs every 4-6 hours as needed for respiratory symptoms of cough, shortness of breath, or wheezing Asthma control goals:  Full participation in all desired activities (may need albuterol  before activity) Albuterol  use two times or less a week on average (not counting  use with activity) Cough interfering with sleep two times or less a month Oral steroids no more than once a year No hospitalizations   Hold all anti-histamines (Xyzal, Allegra, Zyrtec, Claritin, Benadryl, Pepcid ) 3 days prior to next visit.  Follow up: 9 on 11/12 for skin testing 1-55, IDs okay      Arleta Blanch, MD [facility] 

## 2024-04-18 ENCOUNTER — Ambulatory Visit: Admitting: Internal Medicine

## 2024-04-18 DIAGNOSIS — J309 Allergic rhinitis, unspecified: Secondary | ICD-10-CM

## 2024-06-26 ENCOUNTER — Other Ambulatory Visit: Payer: Self-pay | Admitting: Family Medicine

## 2024-06-26 DIAGNOSIS — I10 Essential (primary) hypertension: Secondary | ICD-10-CM
